# Patient Record
Sex: Female | Born: 1995 | Race: Black or African American | Hispanic: No | Marital: Single | Smoking: Former smoker
Health system: Southern US, Community
[De-identification: ages and names within clinical notes are randomized; demographics above are authoritative.]

## PROBLEM LIST (undated history)

## (undated) DIAGNOSIS — D573 Sickle-cell trait: Secondary | ICD-10-CM

## (undated) DIAGNOSIS — D649 Anemia, unspecified: Secondary | ICD-10-CM

## (undated) HISTORY — PX: NO PAST SURGERIES: SHX2092

## (undated) HISTORY — DX: Sickle-cell trait: D57.3

## (undated) HISTORY — DX: Anemia, unspecified: D64.9

---

## 2016-04-23 ENCOUNTER — Emergency Department (HOSPITAL_COMMUNITY)
Admission: EM | Admit: 2016-04-23 | Discharge: 2016-04-23 | Disposition: A | Discharge status: Home / Self Care | Payer: Medicaid Other | Attending: Emergency Medicine | Admitting: Emergency Medicine

## 2016-04-23 ENCOUNTER — Encounter (HOSPITAL_COMMUNITY): Payer: Self-pay

## 2016-04-23 DIAGNOSIS — Z3201 Encounter for pregnancy test, result positive: Secondary | ICD-10-CM | POA: Insufficient documentation

## 2016-04-23 DIAGNOSIS — Z3200 Encounter for pregnancy test, result unknown: Secondary | ICD-10-CM | POA: Diagnosis present

## 2016-04-23 DIAGNOSIS — Z3491 Encounter for supervision of normal pregnancy, unspecified, first trimester: Secondary | ICD-10-CM

## 2016-04-23 LAB — URINALYSIS, ROUTINE W REFLEX MICROSCOPIC
Bilirubin Urine: NEGATIVE
GLUCOSE, UA: NEGATIVE mg/dL
HGB URINE DIPSTICK: NEGATIVE
Ketones, ur: NEGATIVE mg/dL
Nitrite: NEGATIVE
Protein, ur: NEGATIVE mg/dL
SPECIFIC GRAVITY, URINE: 1.02 (ref 1.005–1.030)
pH: 5 (ref 5.0–8.0)

## 2016-04-23 LAB — URINE MICROSCOPIC-ADD ON: RBC / HPF: NONE SEEN RBC/hpf (ref 0–5)

## 2016-04-23 LAB — POC URINE PREG, ED: Preg Test, Ur: POSITIVE — AB

## 2016-04-23 MED ORDER — FOSFOMYCIN TROMETHAMINE 3 G PO PACK
3.0000 g | PACK | Freq: Once | ORAL | Status: AC
  Administered 2016-04-23: 3 g via ORAL
  Filled 2016-04-23: qty 3

## 2016-04-23 NOTE — Discharge Instructions (Signed)
Find an OB to start your care.  There are no known safe medications in pregnancy, tylenol is thought to be safe, but there has been no intense studies to confirm this.  Talk with your OB before starting any over the counter medications.

## 2016-04-23 NOTE — ED Provider Notes (Addendum)
[facility]-EMER[facility]ENCY DEPT Provider Note   CSN: [id number] Arrival date & time: 04/23/16  1205     History   Chief Complaint No chief complaint on file.   HPI Desiree Graham is a 20 y.o. female.  20 yo F with a chief complaint of possibly being pregnant. Patient had a home pregnancy test that was positive she wishes to be on Medicaid and so came to the ED for confirmation. She denies any symptoms denies vaginal bleeding denies cramping denies vomiting denies dysuria or increased frequency or hesitancy. This will be her second pregnancy. Her first child is 21-year-old. She has no planned OB care at this time.   The history is provided by the patient.  Illness  This is a new problem. The current episode started yesterday. The problem occurs constantly. The problem has not changed since onset.Pertinent negatives include no chest pain, no headaches and no shortness of breath. Nothing aggravates the symptoms. Nothing relieves the symptoms. She has tried nothing for the symptoms. The treatment provided no relief.    History reviewed. No pertinent past medical history.  There are no active problems to display for this patient.   History reviewed. No pertinent surgical history.  OB History    No data available       Home Medications    Prior to Admission medications   Not on File    Family History No family history on file.  Social History Social History  Substance Use Topics  . Smoking status: Not on file  . Smokeless tobacco: Not on file  . Alcohol use Not on file     Allergies   Patient has no known allergies.   Review of Systems Review of Systems  Constitutional: Negative for chills and fever.  HENT: Negative for congestion and rhinorrhea.   Eyes: Negative for redness and visual disturbance.  Respiratory: Negative for shortness of breath and wheezing.   Cardiovascular: Negative for chest pain and palpitations.  Gastrointestinal: Negative for nausea and vomiting.   Genitourinary: Negative for dysuria and urgency.  Musculoskeletal: Negative for arthralgias and myalgias.  Skin: Negative for pallor and wound.  Neurological: Negative for dizziness and headaches.     Physical Exam Updated Vital Signs BP 115/71 (BP Location: Left Arm)   Pulse 74   Temp 98.2 F (36.8 C) (Oral)   Resp 18   Ht 5\' 2"  (1.575 m)   Wt 88 lb (39.9 kg)   LMP 03/12/2016   SpO2 100%   BMI 16.10 kg/m   Physical Exam  Constitutional: She is oriented to person, place, and time. She appears well-developed and well-nourished. No distress.  HENT:  Head: Normocephalic and atraumatic.  Eyes: EOM are normal. Pupils are equal, round, and reactive to light.  Neck: Normal range of motion. Neck supple.  Cardiovascular: Normal rate and regular rhythm.  Exam reveals no gallop and no friction rub.   No murmur heard. Pulmonary/Chest: Effort normal. She has no wheezes. She has no rales.  Abdominal: Soft. She exhibits no distension and no mass. There is no tenderness. There is no guarding.  Musculoskeletal: She exhibits no edema or tenderness.  Neurological: She is alert and oriented to person, place, and time.  Skin: Skin is warm and dry. She is not diaphoretic.  Psychiatric: She has a normal mood and affect. Her behavior is normal.  Nursing note and vitals reviewed.    ED Treatments / Results  Labs (all labs ordered are listed, but only abnormal results are displayed) Labs  Reviewed  URINALYSIS, ROUTINE W REFLEX MICROSCOPIC (NOT AT [facility]) - Abnormal; Notable for the following:       Result Value   APPearance CLOUDY (*)    Leukocytes, UA MODERATE (*)    All other components within normal limits  URINE MICROSCOPIC-ADD ON - Abnormal; Notable for the following:    Squamous Epithelial / LPF 0-5 (*)    Bacteria, UA MANY (*)    All other components within normal limits  POC URINE PREG, ED - Abnormal; Notable for the following:    Preg Test, Ur POSITIVE (*)    All other components  within normal limits    EKG  EKG Interpretation None       Radiology No results found.  Procedures Procedures (including critical care time)  Medications Ordered in ED Medications  fosfomycin (MONUROL) packet 3 g (not administered)     Initial Impression / Assessment and Plan / ED Course  I have reviewed the triage vital signs and the nursing notes.  Pertinent labs & imaging results that were available during my care of the patient were reviewed by me and considered in my medical decision making (see chart for details).  Clinical Course     20 yo F Here for pregnancy confirmation so that she can apply for Medicaid. Denies any symptoms. Will obtain a UA to evaluate for UTI. [facility]  UA with possible asymptomatic UTI, will treat with fosfomycin.   1:56 PM:  I have discussed the diagnosis/risks/treatment options with the patient and family and believe the pt to be eligible for discharge home to follow-up with OB. We also discussed returning to the ED immediately if new or worsening sx occur. We discussed the sx which are most concerning (e.g., sudden worsening pain, fever, inability to tolerate by mouth, vaginal bleeding, pelvic cramping, blurry vision, leg swelling) that necessitate immediate return. Medications administered to the patient during their visit and any new prescriptions provided to the patient are listed below.  Medications given during this visit Medications  fosfomycin (MONUROL) packet 3 g (not administered)     The patient appears reasonably screen and/or stabilized for discharge and I doubt any other medical condition or other [facility] requiring further screening, evaluation, or treatment in the ED at this time prior to discharge.    Final Clinical Impressions(s) / ED Diagnoses   Final diagnoses:  First trimester pregnancy    New Prescriptions New Prescriptions   No medications on file     Melene PlanDan Donnita Farina, DO 04/23/16  1355    Melene Planan Nour Scalise, DO 04/23/16 1356

## 2016-04-23 NOTE — ED Triage Notes (Signed)
Patient requesting confirmation of pregnancy test that she took at home, no complaints

## 2017-03-30 ENCOUNTER — Encounter: Payer: Self-pay | Admitting: *Deleted

## 2017-04-12 ENCOUNTER — Encounter: Payer: Self-pay | Admitting: Student

## 2017-04-12 ENCOUNTER — Other Ambulatory Visit (HOSPITAL_COMMUNITY)
Admission: RE | Admit: 2017-04-12 | Discharge: 2017-04-12 | Disposition: A | Discharge status: Home / Self Care | Payer: Medicaid Other | Source: Ambulatory Visit | Attending: Student | Admitting: Student

## 2017-04-12 ENCOUNTER — Ambulatory Visit (INDEPENDENT_AMBULATORY_CARE_PROVIDER_SITE_OTHER): Payer: Medicaid Other | Admitting: Student

## 2017-04-12 VITALS — BP 112/68 | HR 106 | Wt 95.3 lb

## 2017-04-12 DIAGNOSIS — O09212 Supervision of pregnancy with history of pre-term labor, second trimester: Secondary | ICD-10-CM

## 2017-04-12 DIAGNOSIS — Z3481 Encounter for supervision of other normal pregnancy, first trimester: Secondary | ICD-10-CM | POA: Diagnosis not present

## 2017-04-12 DIAGNOSIS — Z3482 Encounter for supervision of other normal pregnancy, second trimester: Secondary | ICD-10-CM | POA: Diagnosis present

## 2017-04-12 DIAGNOSIS — Z331 Pregnant state, incidental: Secondary | ICD-10-CM

## 2017-04-12 DIAGNOSIS — B379 Candidiasis, unspecified: Secondary | ICD-10-CM | POA: Diagnosis not present

## 2017-04-12 DIAGNOSIS — Z113 Encounter for screening for infections with a predominantly sexual mode of transmission: Secondary | ICD-10-CM | POA: Diagnosis not present

## 2017-04-12 DIAGNOSIS — Z3480 Encounter for supervision of other normal pregnancy, unspecified trimester: Secondary | ICD-10-CM

## 2017-04-12 DIAGNOSIS — O09892 Supervision of other high risk pregnancies, second trimester: Secondary | ICD-10-CM

## 2017-04-12 DIAGNOSIS — A599 Trichomoniasis, unspecified: Secondary | ICD-10-CM

## 2017-04-12 DIAGNOSIS — Z3689 Encounter for other specified antenatal screening: Secondary | ICD-10-CM

## 2017-04-12 DIAGNOSIS — O0990 Supervision of high risk pregnancy, unspecified, unspecified trimester: Secondary | ICD-10-CM | POA: Insufficient documentation

## 2017-04-12 NOTE — Patient Instructions (Signed)
Preventing Preterm Birth Preterm birth is when your baby is delivered between 72 weeks and 37 weeks of pregnancy. A full-term pregnancy lasts for at least 37 weeks. Preterm birth can be dangerous for your baby because the last few weeks of pregnancy are an important time for your baby's brain and lungs to grow. Many things can cause a baby to be born early. Sometimes the cause is not known. There are certain factors that make you more likely to experience preterm birth, such as:  Having a previous baby born preterm.  Being pregnant with twins or other multiples.  Having had fertility treatment.  Being overweight or underweight at the start of your pregnancy.  Having any of the following during pregnancy: ? An infection, including a urinary tract infection (UTI) or an STI (sexually transmitted infection). ? High blood pressure. ? Diabetes. ? Vaginal bleeding.  Being age 38 or older.  Being age 50 or younger.  Getting pregnant within 6 months of a previous pregnancy.  Suffering extreme stress or physical or emotional abuse during pregnancy.  Standing for long periods of time during pregnancy, such as working at a job that requires standing.  What are the risks? The most serious risk of preterm birth is that the baby may not survive. This is more likely to happen if a baby is born before 13 weeks. Other risks and complications of preterm birth may include your baby having:  Breathing problems.  Brain damage that affects movement and coordination (cerebral palsy).  Feeding difficulties.  Vision or hearing problems.  Infections or inflammation of the digestive tract (colitis).  Developmental delays.  Learning disabilities.  Higher risk for diabetes, heart disease, and high blood pressure later in life.  What can I do to lower my risk? Medical care  The most important thing you can do to lower your risk for preterm birth is to get routine medical care during pregnancy  (prenatal care). If you have a high risk of preterm birth, you may be referred to a health care provider who specializes in managing high-risk pregnancies (perinatologist). You may be given medicine to help prevent preterm birth. Lifestyle changes Certain lifestyle changes can also lower your risk of preterm birth:  Wait at least 6 months after a pregnancy to become pregnant again.  Try to plan pregnancy for when you are between 57 and 77 years old.  Get to a healthy weight before getting pregnant. If you are overweight, work with your health care provider to safely lose weight.  Do not use any products that contain nicotine or tobacco, such as cigarettes and e-cigarettes. If you need help quitting, ask your health care provider.  Do not drink alcohol.  Do not use drugs.  Where to find support: For more support, consider:  Talking with your health care provider.  Talking with a therapist or substance abuse counselor, if you need help quitting.  Working with a diet and nutrition specialist (dietitian) or a Physiological scientist to maintain a healthy weight.  Joining a support group.  Where to find more information: Learn more about preventing preterm birth from:  Centers for Disease Control and Prevention: VoipObserver.com.br  March of Dimes: marchofdimes.org/complications/premature-babies.aspx  American Pregnancy Association: americanpregnancy.org/labor-and-birth/premature-labor  Contact a health care provider if:  You have any of the following signs of preterm labor before 37 weeks: ? A change or increase in vaginal discharge. ? Fluid leaking from your vagina. ? Pressure or cramps in your lower abdomen. ? A backache that does not  go away or gets worse. ? Regular tightening (contractions) in your lower abdomen. Summary  Preterm birth means having your baby during weeks 20-37 of pregnancy.  Preterm birth may put your baby at risk  for physical and mental problems.  Getting good prenatal care can help prevent preterm birth.  You can lower your risk of preterm birth by making certain lifestyle changes, such as not smoking and not using alcohol. This information is not intended to replace advice given to you by your health care provider. Make sure you discuss any questions you have with your health care provider. Document Released: 07/13/2015 Document Revised: 02/05/2016 Document Reviewed: 02/05/2016 Elsevier Interactive Patient Education  2018 [employer] Intrauterine Device Information An intrauterine device (IUD) is inserted into your uterus to prevent pregnancy. There are two types of IUDs available:  Copper IUD-This type of IUD is wrapped in copper wire and is placed inside the uterus. Copper makes the uterus and fallopian tubes produce a fluid that kills sperm. The copper IUD can stay in place for 10 years.  Hormone IUD-This type of IUD contains the hormone progestin (synthetic progesterone). The hormone thickens the cervical mucus and prevents sperm from entering the uterus. It also thins the uterine lining to prevent implantation of a fertilized egg. The hormone can weaken or kill the sperm that get into the uterus. One type of hormone IUD can stay in place for 5 years, and another type can stay in place for 3 years.  Your health care provider will make sure you are a good candidate for a contraceptive IUD. Discuss with your health care provider the possible side effects. Advantages of an intrauterine device  IUDs are highly effective, reversible, long acting, and low maintenance.  There are no estrogen-related side effects.  An IUD can be used when breastfeeding.  IUDs are not associated with weight gain.  The copper IUD works immediately after insertion.  The hormone IUD works right away if inserted within 7 days of your period starting. You will need to use a backup method of birth control for 7 days if  the hormone IUD is inserted at any other time in your cycle.  The copper IUD does not interfere with your female hormones.  The hormone IUD can make heavy menstrual periods lighter and decrease cramping.  The hormone IUD can be used for 3 or 5 years.  The copper IUD can be used for 10 years. Disadvantages of an intrauterine device  The hormone IUD can be associated with irregular bleeding patterns.  The copper IUD can make your menstrual flow heavier and more painful.  You may experience cramping and vaginal bleeding after insertion. This information is not intended to replace advice given to you by your health care provider. Make sure you discuss any questions you have with your health care provider. Document Released: 05/02/2004 Document Revised: 11/04/2015 Document Reviewed: 11/17/2012 Elsevier Interactive Patient Education  2017 [employer]

## 2017-04-12 NOTE — Progress Notes (Signed)
  Subjective:    Desiree Graham is being seen today for her first obstetrical visit.  This is not a planned pregnancy. She is at 2796w6d gestation. Her obstetrical history is significant for pre-term delivery in 2nd pregnancy. . Relationship with FOB: significant other, living together. Patient does intend to breast feed. Pregnancy history fully reviewed.  Patient reports no complaints.  Review of Systems:   Review of Systems  Constitutional: Negative.   HENT: Negative.   Respiratory: Negative.   Cardiovascular: Negative.   Gastrointestinal: Negative.   Genitourinary: Negative.   Musculoskeletal: Negative.   Psychiatric/Behavioral: Negative.     Objective:     BP 112/68   Pulse (!) 106   Wt 95 lb 4.8 oz (43.2 kg)   LMP 01/05/2017   BMI 17.43 kg/m  Physical Exam  Constitutional: She is oriented to person, place, and time. She appears well-developed and well-nourished.  HENT:  Head: Normocephalic.  Neck: Normal range of motion. No thyromegaly present.  GI: Soft. Bowel sounds are normal.  Genitourinary: Vagina normal.  Musculoskeletal: Normal range of motion.  Neurological: She is alert and oriented to person, place, and time.  Skin: Skin is warm and dry.  Psychiatric: She has a normal mood and affect.    Exam    Assessment:    Pregnancy: [id number] Patient Active Problem List   Diagnosis Date Noted  . Supervision of other normal pregnancy, antepartum 04/12/2017  . History of preterm delivery, currently pregnant in second trimester 04/12/2017       Plan:     Initial labs drawn. Prenatal vitamins. Problem list reviewed and updated. AFP3 discussed: undecided. Role of ultrasound in pregnancy discussed; fetal survey: will order. Amniocentesis discussed: not indicated. Follow up in 4 weeks. 50 % of 20 min visit spent on counseling and coordination of care.  -patient enrolled in babyRX optimization -patient will begin 17P program  Desiree Graham  [facility] 04/12/2017

## 2017-04-12 NOTE — Progress Notes (Signed)
Prenatal education packet given Plans to breastfeed Declines flu vaccine Babyscripts app

## 2017-04-13 LAB — OBSTETRIC PANEL, INCLUDING HIV
Antibody Screen: NEGATIVE
BASOS: 0 %
Basophils Absolute: 0 10*3/uL (ref 0.0–0.2)
EOS (ABSOLUTE): 0.1 10*3/uL (ref 0.0–0.4)
EOS: 1 %
HEMATOCRIT: 26.4 % — AB (ref 34.0–46.6)
HEMOGLOBIN: 8.2 g/dL — AB (ref 11.1–15.9)
HEP B S AG: NEGATIVE
HIV Screen 4th Generation wRfx: NONREACTIVE
IMMATURE GRANS (ABS): 0 10*3/uL (ref 0.0–0.1)
Immature Granulocytes: 0 %
LYMPHS ABS: 1.6 10*3/uL (ref 0.7–3.1)
LYMPHS: 16 %
MCH: 21 pg — AB (ref 26.6–33.0)
MCHC: 31.1 g/dL — ABNORMAL LOW (ref 31.5–35.7)
MCV: 68 fL — ABNORMAL LOW (ref 79–97)
Monocytes Absolute: 0.3 10*3/uL (ref 0.1–0.9)
Monocytes: 3 %
NEUTROS PCT: 80 %
Neutrophils Absolute: 7.9 10*3/uL — ABNORMAL HIGH (ref 1.4–7.0)
Platelets: 319 10*3/uL (ref 150–379)
RBC: 3.9 x10E6/uL (ref 3.77–5.28)
RDW: 16.9 % — ABNORMAL HIGH (ref 12.3–15.4)
RH TYPE: POSITIVE
RPR: NONREACTIVE
Rubella Antibodies, IGG: 18 index (ref >0.99)
WBC: 10 10*3/uL (ref 3.4–10.8)

## 2017-04-13 LAB — CERVICOVAGINAL ANCILLARY ONLY
BACTERIAL VAGINITIS: NEGATIVE
CANDIDA VAGINITIS: POSITIVE — AB
Chlamydia: NEGATIVE
Neisseria Gonorrhea: NEGATIVE
Trichomonas: POSITIVE — AB

## 2017-04-14 LAB — CULTURE, OB URINE

## 2017-04-14 LAB — URINE CULTURE, OB REFLEX

## 2017-04-17 ENCOUNTER — Other Ambulatory Visit: Payer: Self-pay | Admitting: Student

## 2017-04-17 ENCOUNTER — Encounter: Payer: Self-pay | Admitting: Student

## 2017-04-17 DIAGNOSIS — O99012 Anemia complicating pregnancy, second trimester: Secondary | ICD-10-CM | POA: Insufficient documentation

## 2017-04-17 DIAGNOSIS — A599 Trichomoniasis, unspecified: Secondary | ICD-10-CM

## 2017-04-17 MED ORDER — DOCUSATE SODIUM 100 MG PO TABS: 100.0000 mg | ORAL_TABLET | Freq: Two times a day (BID) | ORAL | 3 refills | Status: DC

## 2017-04-17 MED ORDER — METRONIDAZOLE 500 MG PO TABS: 2000.0000 mg | ORAL_TABLET | Freq: Once | ORAL | 0 refills | Status: AC

## 2017-04-17 MED ORDER — TERCONAZOLE 0.4 % VA CREA: 1.0000 | TOPICAL_CREAM | Freq: Every day | VAGINAL | 0 refills | Status: DC

## 2017-04-17 MED ORDER — FERROUS SULFATE 325 (65 FE) MG PO TBEC: 325.0000 mg | DELAYED_RELEASE_TABLET | Freq: Three times a day (TID) | ORAL | 3 refills | Status: DC

## 2017-04-20 ENCOUNTER — Encounter: Payer: Self-pay | Admitting: *Deleted

## 2017-05-02 ENCOUNTER — Encounter (HOSPITAL_COMMUNITY): Payer: Self-pay | Admitting: Student

## 2017-05-14 ENCOUNTER — Other Ambulatory Visit: Payer: Self-pay | Admitting: Student

## 2017-05-14 ENCOUNTER — Encounter (HOSPITAL_COMMUNITY): Payer: Self-pay

## 2017-05-14 ENCOUNTER — Ambulatory Visit (HOSPITAL_COMMUNITY)
Admission: RE | Admit: 2017-05-14 | Discharge: 2017-05-14 | Disposition: A | Discharge status: Home / Self Care | Payer: Medicaid Other | Source: Ambulatory Visit | Attending: Student | Admitting: Student

## 2017-05-14 DIAGNOSIS — Z3689 Encounter for other specified antenatal screening: Secondary | ICD-10-CM

## 2017-05-14 DIAGNOSIS — O09212 Supervision of pregnancy with history of pre-term labor, second trimester: Secondary | ICD-10-CM | POA: Insufficient documentation

## 2017-05-14 DIAGNOSIS — Z3A18 18 weeks gestation of pregnancy: Secondary | ICD-10-CM

## 2017-05-14 DIAGNOSIS — O09892 Supervision of other high risk pregnancies, second trimester: Secondary | ICD-10-CM

## 2017-05-14 DIAGNOSIS — Z3480 Encounter for supervision of other normal pregnancy, unspecified trimester: Secondary | ICD-10-CM

## 2017-05-16 ENCOUNTER — Encounter: Payer: Medicaid Other | Admitting: Advanced Practice Midwife

## 2017-05-17 ENCOUNTER — Encounter: Payer: Medicaid Other | Admitting: Student

## 2017-05-17 ENCOUNTER — Encounter: Payer: Self-pay | Admitting: General Practice

## 2017-06-01 ENCOUNTER — Ambulatory Visit (INDEPENDENT_AMBULATORY_CARE_PROVIDER_SITE_OTHER): Payer: Medicaid Other | Admitting: Obstetrics and Gynecology

## 2017-06-01 VITALS — BP 97/59 | HR 105 | Wt 105.5 lb

## 2017-06-01 DIAGNOSIS — A599 Trichomoniasis, unspecified: Secondary | ICD-10-CM

## 2017-06-01 DIAGNOSIS — Z3686 Encounter for antenatal screening for cervical length: Secondary | ICD-10-CM

## 2017-06-01 DIAGNOSIS — Z8619 Personal history of other infectious and parasitic diseases: Secondary | ICD-10-CM

## 2017-06-01 DIAGNOSIS — O99012 Anemia complicating pregnancy, second trimester: Secondary | ICD-10-CM

## 2017-06-01 DIAGNOSIS — O09892 Supervision of other high risk pregnancies, second trimester: Secondary | ICD-10-CM

## 2017-06-01 DIAGNOSIS — Z3480 Encounter for supervision of other normal pregnancy, unspecified trimester: Secondary | ICD-10-CM

## 2017-06-01 DIAGNOSIS — O09212 Supervision of pregnancy with history of pre-term labor, second trimester: Secondary | ICD-10-CM

## 2017-06-01 MED ORDER — METRONIDAZOLE 500 MG PO TABS: 2000.0000 mg | ORAL_TABLET | Freq: Once | ORAL | 0 refills | Status: AC

## 2017-06-01 NOTE — Progress Notes (Addendum)
   PRENATAL VISIT NOTE  Subjective:  Desiree Graham is a 21 y.o. G3P1102 at 6341w6d being seen today for ongoing prenatal care.  She is currently monitored for the following issues for this low-risk pregnancy and has Supervision of other normal pregnancy, antepartum; History of preterm delivery, currently pregnant in second trimester; Trichomoniasis; and Anemia in pregnancy, second trimester on their problem list.  Patient reports no complaints.  Contractions: Not present. Vag. Bleeding: None.  Movement: Present. Denies leaking of fluid.   The following portions of the patient's history were reviewed and updated as appropriate: allergies, current medications, past family history, past medical history, past social history, past surgical history and problem list. Problem list updated.  Objective:   Vitals:   06/01/17 0938  BP: (!) 97/59  Pulse: (!) 105  Weight: 105 lb 8 oz (47.9 kg)    Fetal Status: Fetal Heart Rate (bpm): 150 Fundal Height: 20 cm Movement: Present     General:  Alert, oriented and cooperative. Patient is in no acute distress.  Skin: Skin is warm and dry. No rash noted.   Cardiovascular: Normal heart rate noted  Respiratory: Normal respiratory effort, no problems with respiration noted  Abdomen: Soft, gravid, appropriate for gestational age.  Pain/Pressure: Absent     Pelvic: Cervical exam deferred        Extremities: Normal range of motion.  Edema: None  Mental Status:  Normal mood and affect. Normal behavior. Normal judgment and thought content.   Assessment and Plan:  Pregnancy: G3P1102 at 441w6d  1. History of trichomoniasis  Discussed the importance of treatment.  Partner needs treatment.  Rx again sent for Flagyl   2. History of preterm delivery, currently pregnant in second trimester  Cervical length at 22 weeks and 24 weeks  Delivery at 35.5 weeks due to preterm labor  No complaints Declines 17p  3. Supervision of other normal pregnancy,  antepartum  - Declined Flu vaccine  - Decline Quad- genetic testing   4. Trichomoniasis  Rx: Flagyl   5. Anemia in pregnancy, second trimester  Rx: Iron, patient did not fill. Discussed importance of taking Iron along with iron rich foods.   Preterm labor symptoms and general obstetric precautions including but not limited to vaginal bleeding, contractions, leaking of fluid and fetal movement were reviewed in detail with the patient. Please refer to After Visit Summary for other counseling recommendations.  Return in about 4 weeks (around 06/29/2017).   Venia CarbonJennifer Staphanie Harbison, NP

## 2017-06-01 NOTE — Patient Instructions (Addendum)
Pregnancy and Anemia Anemia is a condition in which the concentration of red blood cells or hemoglobin in the blood is below normal. Hemoglobin is a substance in red blood cells that carries oxygen to the tissues of the body. Anemia results in not enough oxygen reaching these tissues. Anemia during pregnancy is common because the fetus uses more iron and folic acid as it is developing. Your body may not produce enough red blood cells because of this. Also, during pregnancy, the liquid part of the blood (plasma) increases by about 50%, and the red blood cells increase by only 25%. This lowers the concentration of the red blood cells and creates a natural anemia-like situation. What are the causes? The most common cause of anemia during pregnancy is not having enough iron in the body to make red blood cells (iron deficiency anemia). Other causes may include:  Folic acid deficiency.  Vitamin B12 deficiency.  Certain prescription or over-the-counter medicines.  Certain medical conditions or infections that destroy red blood cells.  A low platelet count and bleeding caused by antibodies that go through the placenta to the fetus from the mother's blood.  What are the signs or symptoms? Mild anemia may not be noticeable. If it becomes severe, symptoms may include:  Tiredness.  Shortness of breath, especially with exercise.  Weakness.  Fainting.  Pale looking skin.  Headaches.  Feeling a fast or irregular heartbeat (palpitations).  How is this diagnosed? The type of anemia is usually diagnosed from your family and medical history and blood tests. How is this treated? Treatment of anemia during pregnancy depends on the cause of the anemia. Treatment can include:  Supplements of iron, vitamin B12, or folic acid.  A blood transfusion. This may be needed if blood loss is severe.  Hospitalization. This may be needed if there is significant continual blood loss.  Dietary  changes.  Follow these instructions at home:  Follow your dietitian's or health care provider's dietary recommendations.  Increase your vitamin C intake. This will help the stomach absorb more iron.  Eat a diet rich in iron. This would include foods such as: ? Liver. ? Beef. ? Whole grain bread. ? Eggs. ? Dried fruit.  Take iron and vitamins as directed by your health care provider.  Eat green leafy vegetables. These are a good source of folic acid. Contact a health care provider if:  You have frequent or lasting headaches.  You are looking pale.  You are bruising easily. Get help right away if:  You have extreme weakness, shortness of breath, or chest pain.  You become dizzy or have trouble concentrating.  You have heavy vaginal bleeding.  You develop a rash.  You have bloody or black, tarry stools.  You faint.  You vomit up blood.  You vomit repeatedly.  You have abdominal pain.  You have a fever or persistent symptoms for more than 2-3        Trichomoniasis Trichomoniasis is an STI (sexually transmitted infection) that can affect both women and men. In women, the outer area of the female genitalia (vulva) and the vagina are affected. In men, the penis is mainly affected, but the prostate and other reproductive organs can also be involved. This condition can be treated with medicine. It often has no symptoms (is asymptomatic), especially in men. What are the causes? This condition is caused by an organism called Trichomonas vaginalis. Trichomoniasis most often spreads from person to person (is contagious) through sexual contact. What increases the  risk? The following factors may make you more likely to develop this condition: Having unprotected sexual intercourse. Having sexual intercourse with a partner who has trichomoniasis. Having multiple sexual partners. Having had previous trichomoniasis infections or other STIs.  What are the signs or  symptoms? In women, symptoms of trichomoniasis include: Abnormal vaginal discharge that is clear, white, gray, or yellow-green and foamy and has an unusual "fishy" odor. Itching and irritation of the vagina and vulva. Burning or pain during urination or sexual intercourse. Genital redness and swelling.  In men, symptoms of trichomoniasis include: Penile discharge that may be foamy or contain pus. Pain in the penis. This may happen only when urinating. Itching or irritation inside the penis. Burning after urination or ejaculation.  How is this diagnosed? In women, this condition may be found during a routine Pap test or physical exam. It may be found in men during a routine physical exam. Your health care provider may perform tests to help diagnose this infection, such as: Urine tests (men and women). The following in women: Testing the pH of the vagina. A vaginal swab test that checks for the Trichomonas vaginalis organism. Testing vaginal secretions.  Your health care provider may test you for other STIs, including HIV (human immunodeficiency virus). How is this treated? This condition is treated with medicine taken by mouth (orally), such as metronidazole or tinidazole to fight the infection. Your sexual partner(s) may also need to be tested and treated. If you are a woman and you plan to become pregnant or think you may be pregnant, tell your health care provider right away. Some medicines that are used to treat the infection should not be taken during pregnancy.  Your health care provider may recommend over-the-counter medicines or creams to help relieve itching or irritation. You may be tested for infection again 3 months after treatment. Follow these instructions at home: Take and use over-the-counter and prescription medicines, including creams, only as told by your health care provider. Do not have sexual intercourse until one week after you finish your medicine, or until your  health care provider approves. Ask your health care provider when you may resume sexual intercourse. (Women) Do not douche or wear tampons while you have the infection. Discuss your infection with your sexual partner(s). Make sure that your partner gets tested and treated, if necessary. Keep all follow-up visits as told by your health care provider. This is important. How is this prevented? Use condoms every time you have sex. Using condoms correctly and consistently can help protect against STIs. Avoid having multiple sexual partners. Talk with your sexual partner about any symptoms that either of you may have, as well as any history of STIs. Get tested for STIs and STDs (sexually transmitted diseases) before you have sex. Ask your partner to do the same. Do not have sexual contact if you have symptoms of trichomoniasis or another STI. Contact a health care provider if: You still have symptoms after you finish your medicine. You develop pain in your abdomen. You have pain when you urinate. You have bleeding after sexual intercourse. You develop a rash. You feel nauseous or you vomit. You plan to become pregnant or think you may be pregnant. Summary Trichomoniasis is an STI (sexually transmitted infection) that can affect both women and men. This condition often has no symptoms (is asymptomatic), especially in men. You should not have sexual intercourse until one week after you finish your medicine, or until your health care provider approves. Ask your  health care provider when you may resume sexual intercourse. Discuss your infection with your sexual partner. Make sure that your partner gets tested and treated, if necessary. This information is not intended to replace advice given to you by your health care provider. Make sure you discuss any questions you have with your health care provider. Document Released: 11/22/2000 Document Revised: 04/21/2016 Document Reviewed: 04/21/2016 Elsevier  Interactive Patient Education  2017 [employer]   days.  You have a fever and your symptoms suddenly get worse.  You are dehydrated. This information is not intended to replace advice given to you by your health care provider. Make sure you discuss any questions you have with your health care provider. Document Released: 05/26/2000 Document Revised: 11/04/2015 Document Reviewed: 01/08/2013 Elsevier Interactive Patient Education  2017 [employer]

## 2017-06-01 NOTE — Addendum Note (Signed)
Addended by: Hamilton CapriBURCH, Annastasia Haskins J on: 06/01/2017 10:31 AM   Modules accepted: Orders

## 2017-06-04 ENCOUNTER — Encounter: Payer: Self-pay | Admitting: Obstetrics and Gynecology

## 2017-06-04 ENCOUNTER — Telehealth: Payer: Self-pay | Admitting: Obstetrics and Gynecology

## 2017-06-04 NOTE — Telephone Encounter (Signed)
I attempted to call Ms. Desiree Graham to notify her of the recommendation for ultrasounds at 22 weeks and 24 weeks to measure her cervical length. She has a history of preterm delivery, not on 17P. Please notify the patient if she calls that the order is place and the [place] department will call her to schedule. I also left a message with her through Mychart as her phone had no option to leave a voicemail. Thank you.  Marella ChimesJennifer Etan Vasudevan  Danuta Huseman I, NP 06/04/2017 8:46 AM

## 2017-06-11 ENCOUNTER — Other Ambulatory Visit: Payer: Self-pay | Admitting: Obstetrics and Gynecology

## 2017-06-11 ENCOUNTER — Ambulatory Visit (HOSPITAL_COMMUNITY)
Admission: RE | Admit: 2017-06-11 | Discharge: 2017-06-11 | Disposition: A | Discharge status: Home / Self Care | Payer: Medicaid Other | Source: Ambulatory Visit | Attending: Obstetrics and Gynecology | Admitting: Obstetrics and Gynecology

## 2017-06-11 DIAGNOSIS — O09899 Supervision of other high risk pregnancies, unspecified trimester: Secondary | ICD-10-CM

## 2017-06-11 DIAGNOSIS — O09212 Supervision of pregnancy with history of pre-term labor, second trimester: Secondary | ICD-10-CM | POA: Diagnosis present

## 2017-06-11 DIAGNOSIS — Z3686 Encounter for antenatal screening for cervical length: Secondary | ICD-10-CM

## 2017-06-11 DIAGNOSIS — O09892 Supervision of other high risk pregnancies, second trimester: Secondary | ICD-10-CM | POA: Insufficient documentation

## 2017-06-11 DIAGNOSIS — Z3A22 22 weeks gestation of pregnancy: Secondary | ICD-10-CM

## 2017-06-11 DIAGNOSIS — O09219 Supervision of pregnancy with history of pre-term labor, unspecified trimester: Secondary | ICD-10-CM

## 2017-06-12 NOTE — L&D Delivery Note (Signed)
22 y.o. [id number] at 7463w3d delivered a viable baby girl infant in cephalic, LOA position. There was no nuchal cord, but there was a complex presentation with left hand. The anterior shoulder delivered with ease. 60 sec delayed cord clamping was performed. Cord clamped x2 and cut. Placenta delivered spontaneously intact, with 3VC. Fundus firm on exam with massage and pitocin. Good hemostasis noted.  Anesthesia: Epidural Laceration: none Suture:  n/a Good hemostasis noted. EBL: 200cc  Mom and baby recovering in LDR.    Apgars: APGAR (1 MIN): 8   APGAR (5 MINS): 9   APGAR (10 MINS):   Weight: Pending skin to skin    Gorden HarmsMegan Joanna Hall, MD PGY-3 10/02/2017, 12:23 PM

## 2017-06-22 ENCOUNTER — Encounter: Payer: Medicaid Other | Admitting: Obstetrics and Gynecology

## 2017-06-22 NOTE — Progress Notes (Deleted)
Subjective:  Desiree Graham is a 22 y.o. G3P1102 at 2526w6d being seen today for ongoing prenatal care.  She is currently monitored for the following issues for this {Blank single:19197::"high-risk","low-risk"} pregnancy and has Supervision of other normal pregnancy, antepartum; History of preterm delivery, currently pregnant in second trimester; Trichomoniasis; and Anemia in pregnancy, second trimester on their problem list.  Patient reports {sx:14538}.   .  .   . Denies leaking of fluid.   The following portions of the patient's history were reviewed and updated as appropriate: allergies, current medications, past family history, past medical history, past social history, past surgical history and problem list. Problem list updated.  Objective:  There were no vitals filed for this visit.  Fetal Status:           General:  Alert, oriented and cooperative. Patient is in no acute distress.  Skin: Skin is warm and dry. No rash noted.   Cardiovascular: Normal heart rate noted  Respiratory: Normal respiratory effort, no problems with respiration noted  Abdomen: Soft, gravid, appropriate for gestational age.       Pelvic:       {Blank single:19197::"Cervical exam performed","Cervical exam deferred"}        Extremities: Normal range of motion.     Mental Status: Normal mood and affect. Normal behavior. Normal judgment and thought content.   Urinalysis:      Assessment and Plan:  Pregnancy: G3P1102 at 6726w6d  1. Supervision of other normal pregnancy, antepartum ***  2. History of preterm delivery, currently pregnant in second trimester ***  3. Trichomoniasis ***  4. Anemia in pregnancy, second trimester ***  {Blank single:19197::"Term","Preterm"} labor symptoms and general obstetric precautions including but not limited to vaginal bleeding, contractions, leaking of fluid and fetal movement were reviewed in detail with the patient. Please refer to After Visit Summary for other counseling  recommendations.  No Follow-up on file.   Pincus LargePhelps, Jazma Y, DO

## 2017-06-28 ENCOUNTER — Other Ambulatory Visit (HOSPITAL_COMMUNITY)
Admission: RE | Admit: 2017-06-28 | Discharge: 2017-06-28 | Disposition: A | Discharge status: Home / Self Care | Payer: Medicaid Other | Source: Ambulatory Visit | Attending: Student | Admitting: Student

## 2017-06-28 ENCOUNTER — Ambulatory Visit (INDEPENDENT_AMBULATORY_CARE_PROVIDER_SITE_OTHER): Payer: Medicaid Other | Admitting: Student

## 2017-06-28 VITALS — BP 105/63 | HR 82 | Wt 111.1 lb

## 2017-06-28 DIAGNOSIS — Z3480 Encounter for supervision of other normal pregnancy, unspecified trimester: Secondary | ICD-10-CM

## 2017-06-28 DIAGNOSIS — A599 Trichomoniasis, unspecified: Secondary | ICD-10-CM | POA: Diagnosis present

## 2017-06-28 DIAGNOSIS — O99012 Anemia complicating pregnancy, second trimester: Secondary | ICD-10-CM

## 2017-06-28 DIAGNOSIS — O09892 Supervision of other high risk pregnancies, second trimester: Secondary | ICD-10-CM

## 2017-06-28 DIAGNOSIS — O09212 Supervision of pregnancy with history of pre-term labor, second trimester: Secondary | ICD-10-CM

## 2017-06-28 MED ORDER — FERROUS SULFATE 325 (65 FE) MG PO TBEC: 325.0000 mg | DELAYED_RELEASE_TABLET | Freq: Three times a day (TID) | ORAL | 3 refills | Status: DC

## 2017-06-28 NOTE — Progress Notes (Signed)
   PRENATAL VISIT NOTE  Subjective:  Desiree Graham is a 22 y.o. G3P1102 at 6199w5d being seen today for ongoing prenatal care.  She is currently monitored for the following issues for this low-risk pregnancy and has Supervision of other normal pregnancy, antepartum; History of preterm delivery, currently pregnant in second trimester; Trichomoniasis; and Anemia in pregnancy, second trimester on their problem list.  Patient reports no complaints.  Contractions: Not present. Vag. Bleeding: None.  Movement: Present. Denies leaking of fluid.   The following portions of the patient's history were reviewed and updated as appropriate: allergies, current medications, past family history, past medical history, past social history, past surgical history and problem list. Problem list updated.  Objective:   Vitals:   06/28/17 1459  BP: 105/63  Pulse: 82  Weight: 111 lb 1.6 oz (50.4 kg)    Fetal Status: Fetal Heart Rate (bpm): 154   Movement: Present     General:  Alert, oriented and cooperative. Patient is in no acute distress.  Skin: Skin is warm and dry. No rash noted.   Cardiovascular: Normal heart rate noted  Respiratory: Normal respiratory effort, no problems with respiration noted  Abdomen: Soft, gravid, appropriate for gestational age.  Pain/Pressure: Absent     Pelvic: Cervical exam deferred        Extremities: Normal range of motion.  Edema: None  Mental Status:  Normal mood and affect. Normal behavior. Normal judgment and thought content.   Assessment and Plan:  Pregnancy: G3P1102 at 9499w5d  1. Trichomoniasis Patient states she finished meds; partner in the room and patient anxious to leave so I did not ask more questions.  - Cervicovaginal ancillary only as TOC  2. Anemia in pregnancy, second trimester -reminded her to keep taking her iron - CBC  3. History of preterm delivery, currently pregnant in second trimester -Declined 17p; patient states she does not want anything  "unnatural" in her body and her mom told her that she had never heard of it.  -Encouraged patient to reconsider and gave her information on preventing pre-term birth - [place] MFM OB Transvaginal; Future  4. Supervision of other normal pregnancy, antepartum   Preterm labor symptoms and general obstetric precautions including but not limited to vaginal bleeding, contractions, leaking of fluid and fetal movement were reviewed in detail with the patient. Please refer to After Visit Summary for other counseling recommendations.  Return in about 4 weeks (around 07/26/2017).   Desiree Graham, CNM

## 2017-06-28 NOTE — Patient Instructions (Signed)
Preventing Preterm Birth °Preterm birth is when your baby is delivered between 20 weeks and 37 weeks of pregnancy. A full-term pregnancy lasts for at least 37 weeks. Preterm birth can be dangerous for your baby because the last few weeks of pregnancy are an important time for your baby's brain and lungs to grow. Many things can cause a baby to be born early. Sometimes the cause is not known. There are certain factors that make you more likely to experience preterm birth, such as: °· Having a previous baby born preterm. °· Being pregnant with twins or other multiples. °· Having had fertility treatment. °· Being overweight or underweight at the start of your pregnancy. °· Having any of the following during pregnancy: °? An infection, including a urinary tract infection (UTI) or an STI (sexually transmitted infection). °? High blood pressure. °? Diabetes. °? Vaginal bleeding. °· Being age 35 or older. °· Being age 18 or younger. °· Getting pregnant within 6 months of a previous pregnancy. °· Suffering extreme stress or physical or emotional abuse during pregnancy. °· Standing for long periods of time during pregnancy, such as working at a job that requires standing. ° °What are the risks? °The most serious risk of preterm birth is that the baby may not survive. This is more likely to happen if a baby is born before 34 weeks. Other risks and complications of preterm birth may include your baby having: °· Breathing problems. °· Brain damage that affects movement and coordination (cerebral palsy). °· Feeding difficulties. °· Vision or hearing problems. °· Infections or inflammation of the digestive tract (colitis). °· Developmental delays. °· Learning disabilities. °· Higher risk for diabetes, heart disease, and high blood pressure later in life. ° °What can I do to lower my risk? °Medical care ° °The most important thing you can do to lower your risk for preterm birth is to get routine medical care during pregnancy  (prenatal care). If you have a high risk of preterm birth, you may be referred to a health care provider who specializes in managing high-risk pregnancies (perinatologist). You may be given medicine to help prevent preterm birth. °Lifestyle changes °Certain lifestyle changes can also lower your risk of preterm birth: °· Wait at least 6 months after a pregnancy to become pregnant again. °· Try to plan pregnancy for when you are between 19 and 35 years old. °· Get to a healthy weight before getting pregnant. If you are overweight, work with your health care provider to safely lose weight. °· Do not use any products that contain nicotine or tobacco, such as cigarettes and e-cigarettes. If you need help quitting, ask your health care provider. °· Do not drink alcohol. °· Do not use drugs. ° °Where to find support: °For more support, consider: °· Talking with your health care provider. °· Talking with a therapist or substance abuse counselor, if you need help quitting. °· Working with a diet and nutrition specialist (dietitian) or a personal trainer to maintain a healthy weight. °· Joining a support group. ° °Where to find more information: °Learn more about preventing preterm birth from: °· Centers for Disease Control and Prevention: cdc.gov/reproductivehealth/maternalinfanthealth/pretermbirth.htm °· March of Dimes: marchofdimes.org/complications/premature-babies.aspx °· American Pregnancy Association: americanpregnancy.org/labor-and-birth/premature-labor ° °Contact a health care provider if: °· You have any of the following signs of preterm labor before 37 weeks: °? A change or increase in vaginal discharge. °? Fluid leaking from your vagina. °? Pressure or cramps in your lower abdomen. °? A backache that does not   go away or gets worse. °? Regular tightening (contractions) in your lower abdomen. °Summary °· Preterm birth means having your baby during weeks 20-37 of pregnancy. °· Preterm birth may put your baby at risk  for physical and mental problems. °· Getting good prenatal care can help prevent preterm birth. °· You can lower your risk of preterm birth by making certain lifestyle changes, such as not smoking and not using alcohol. °This information is not intended to replace advice given to you by your health care provider. Make sure you discuss any questions you have with your health care provider. °Document Released: 07/13/2015 Document Revised: 02/05/2016 Document Reviewed: 02/05/2016 °Elsevier Interactive Patient Education © 2018 Elsevier Inc. ° °

## 2017-06-29 LAB — CBC
HEMATOCRIT: 26.7 % — AB (ref 34.0–46.6)
Hemoglobin: 8.1 g/dL — ABNORMAL LOW (ref 11.1–15.9)
MCH: 20.7 pg — ABNORMAL LOW (ref 26.6–33.0)
MCHC: 30.3 g/dL — AB (ref 31.5–35.7)
MCV: 68 fL — AB (ref 79–97)
Platelets: 238 10*3/uL (ref 150–379)
RBC: 3.92 x10E6/uL (ref 3.77–5.28)
RDW: 18 % — AB (ref 12.3–15.4)
WBC: 9.6 10*3/uL (ref 3.4–10.8)

## 2017-06-29 LAB — CERVICOVAGINAL ANCILLARY ONLY
Chlamydia: NEGATIVE
Neisseria Gonorrhea: NEGATIVE
Trichomonas: NEGATIVE

## 2017-07-02 ENCOUNTER — Telehealth: Payer: Self-pay | Admitting: Student

## 2017-07-02 NOTE — Telephone Encounter (Signed)
Tried to reach patient to confirm that she is taking iron 3 times a day. No answer at cell phone or home phone.

## 2017-07-10 ENCOUNTER — Ambulatory Visit (HOSPITAL_COMMUNITY)
Admission: RE | Admit: 2017-07-10 | Discharge: 2017-07-10 | Disposition: A | Discharge status: Home / Self Care | Payer: Medicaid Other | Source: Ambulatory Visit | Attending: Student | Admitting: Student

## 2017-07-17 ENCOUNTER — Encounter (HOSPITAL_COMMUNITY): Payer: Self-pay

## 2017-07-17 ENCOUNTER — Ambulatory Visit (HOSPITAL_COMMUNITY)
Admission: RE | Admit: 2017-07-17 | Discharge: 2017-07-17 | Disposition: A | Discharge status: Home / Self Care | Payer: Medicaid Other | Source: Ambulatory Visit | Attending: Student | Admitting: Student

## 2017-07-17 DIAGNOSIS — O09892 Supervision of other high risk pregnancies, second trimester: Secondary | ICD-10-CM | POA: Diagnosis not present

## 2017-07-17 DIAGNOSIS — O09212 Supervision of pregnancy with history of pre-term labor, second trimester: Secondary | ICD-10-CM | POA: Diagnosis present

## 2017-07-17 DIAGNOSIS — Z3686 Encounter for antenatal screening for cervical length: Secondary | ICD-10-CM | POA: Insufficient documentation

## 2017-07-17 DIAGNOSIS — Z3A27 27 weeks gestation of pregnancy: Secondary | ICD-10-CM | POA: Diagnosis not present

## 2017-08-02 ENCOUNTER — Ambulatory Visit (INDEPENDENT_AMBULATORY_CARE_PROVIDER_SITE_OTHER): Payer: Medicaid Other | Admitting: Student

## 2017-08-02 VITALS — BP 95/66 | HR 93 | Wt 116.0 lb

## 2017-08-02 DIAGNOSIS — O09892 Supervision of other high risk pregnancies, second trimester: Secondary | ICD-10-CM

## 2017-08-02 DIAGNOSIS — D508 Other iron deficiency anemias: Secondary | ICD-10-CM

## 2017-08-02 DIAGNOSIS — Z3480 Encounter for supervision of other normal pregnancy, unspecified trimester: Secondary | ICD-10-CM

## 2017-08-02 DIAGNOSIS — O99012 Anemia complicating pregnancy, second trimester: Secondary | ICD-10-CM

## 2017-08-02 DIAGNOSIS — O09212 Supervision of pregnancy with history of pre-term labor, second trimester: Secondary | ICD-10-CM

## 2017-08-02 DIAGNOSIS — Z3493 Encounter for supervision of normal pregnancy, unspecified, third trimester: Secondary | ICD-10-CM

## 2017-08-02 MED ORDER — DOCUSATE SODIUM 100 MG PO TABS: 100.0000 mg | ORAL_TABLET | Freq: Three times a day (TID) | ORAL | 3 refills | Status: DC

## 2017-08-02 NOTE — Patient Instructions (Signed)

## 2017-08-02 NOTE — Progress Notes (Signed)
   PRENATAL VISIT NOTE  Subjective:  Desiree Graham is a 22 y.o. G3P1102 at [redacted]w[redacted]d being seen today for ongoing prenatal care.  She is currently monitored for the following issues for this high-risk pregnancy and has Supervision of other normal pregnancy, antepartum; History of preterm delivery, currently pregnant in second trimester; Trichomoniasis; and Anemia in pregnancy, second trimester on their problem list.  Patient reports no complaints. She has been taking her iron but only 3 times a week. Has some constipation; did not start taking the medications that were prescribed for her before for constipation.  Contractions: Not present. Vag. Bleeding: None.  Movement: Present. Denies leaking of fluid.   The following portions of the patient's history were reviewed and updated as appropriate: allergies, current medications, past family history, past medical history, past social history, past surgical history and problem list. Problem list updated.  Objective:   Vitals:   08/02/17 1053  BP: 95/66  Pulse: 93  Weight: 116 lb (52.6 kg)    Fetal Status: Fetal Heart Rate (bpm): 163 Fundal Height: 29 cm Movement: Present     General:  Alert, oriented and cooperative. Patient is in no acute distress.  Skin: Skin is warm and dry. No rash noted.   Cardiovascular: Normal heart rate noted  Respiratory: Normal respiratory effort, no problems with respiration noted  Abdomen: Soft, gravid, appropriate for gestational age.  Pain/Pressure: Absent     Pelvic: Cervical exam deferred        Extremities: Normal range of motion.  Edema: None  Mental Status:  Normal mood and affect. Normal behavior. Normal judgment and thought content.   Assessment and Plan:  Pregnancy: G3P1102 at [redacted]w[redacted]d  1. Other iron deficiency anemia -Reviewed importance of taking iron and colace; if Hgb is not improved she may need Fereheme infusion.  - Iron, TIBC and Ferritin Panel  2. Encounter for supervision of normal pregnancy  in third trimester, unspecified gravidity - CBC - HIV antibody (with reflex) - RPR -1 hour done today because she has a hard time making her appointments bc of work  3. Supervision of other normal pregnancy, antepartum   4. History of preterm delivery, currently pregnant in second trimester -Does not have any signs of ptl today; per Dr. Marice Potterove not necessary to do cervical exams digitally unless patient has any complaint. Strict return precautions reviewed on when to visit MAU (bleeding, leaking of fluid, contractions, increaseing pain and pressure)   5. Anemia in pregnancy, second trimester Patient will start colace in addition to her iron.   Preterm labor symptoms and general obstetric precautions including but not limited to vaginal bleeding, contractions, leaking of fluid and fetal movement were reviewed in detail with the patient. Please refer to After Visit Summary for other counseling recommendations.  Return in about 2 weeks (around 08/16/2017).   Desiree Graham, CNM

## 2017-08-03 ENCOUNTER — Telehealth: Payer: Self-pay | Admitting: Student

## 2017-08-03 LAB — CBC
Hematocrit: 25.7 % — ABNORMAL LOW (ref 34.0–46.6)
Hemoglobin: 7.8 g/dL — ABNORMAL LOW (ref 11.1–15.9)
MCH: 19.8 pg — ABNORMAL LOW (ref 26.6–33.0)
MCHC: 30.4 g/dL — ABNORMAL LOW (ref 31.5–35.7)
MCV: 65 fL — AB (ref 79–97)
Platelets: 249 10*3/uL (ref 150–379)
RBC: 3.94 x10E6/uL (ref 3.77–5.28)
RDW: 17.3 % — AB (ref 12.3–15.4)
WBC: 10.4 10*3/uL (ref 3.4–10.8)

## 2017-08-03 LAB — IRON,TIBC AND FERRITIN PANEL
FERRITIN: 5 ng/mL — AB (ref 15–150)
IRON: 19 ug/dL — AB (ref 27–159)
Iron Saturation: 3 % — CL (ref 15–55)
Total Iron Binding Capacity: 558 ug/dL (ref 250–450)
UIBC: 539 ug/dL — AB (ref 131–425)

## 2017-08-03 LAB — HIV ANTIBODY (ROUTINE TESTING W REFLEX): HIV Screen 4th Generation wRfx: NONREACTIVE

## 2017-08-03 LAB — RPR: RPR: NONREACTIVE

## 2017-08-03 LAB — GLUCOSE TOLERANCE, 1 HOUR: GLUCOSE, 1HR PP: 66 mg/dL (ref 65–199)

## 2017-08-03 NOTE — Telephone Encounter (Signed)
Called patiend and left message to call back before 9 pm today to talk about results. Gave her phone number at [facility]; emphasized she cannot reach me after 9 pm.  Otherwise, clinic staff will call her next week.  Luna KitchensKathryn Kooistra CNM

## 2017-08-06 ENCOUNTER — Encounter: Payer: Self-pay | Admitting: *Deleted

## 2017-08-06 ENCOUNTER — Telehealth: Payer: Self-pay | Admitting: General Practice

## 2017-08-06 NOTE — Telephone Encounter (Signed)
-----   Message from Marylene LandKathryn Lorraine Kooistra, CNM sent at 08/03/2017  3:45 PM EST ----- Regarding: patient needs feraheme infusion Hi! This patient's Hgb is quite low; she needs a fereheme infusion this week (week of 2-25) and next week. I spoke to Dr. Mervyn SkeetersA, she said this is something you all can set up. I will call her and let her know that we will be contacting her about this.   Thank you,  Luna KitchensKathryn Kooistra

## 2017-08-06 NOTE — Telephone Encounter (Signed)
[facility]. Called patient, no answer- left message stating we are trying to reach you with a scheduled appt as well as lab results. Please check your mychart account for additional information. Will send mychart message

## 2017-08-10 ENCOUNTER — Encounter: Payer: Self-pay | Admitting: General Practice

## 2017-08-10 ENCOUNTER — Telehealth: Payer: Self-pay | Admitting: General Practice

## 2017-08-10 NOTE — Telephone Encounter (Signed)
Per chart review, patient still has not checked mychart account. Called patient & phone immediately goes to voicemail. Left message to call us back as soon as possible regarding a referral appt scheduled for Tuesday as well as results. Called emergency contact number and phone line is busy. Will send certified letter today

## 2017-08-13 ENCOUNTER — Other Ambulatory Visit (HOSPITAL_COMMUNITY): Payer: Self-pay | Admitting: *Deleted

## 2017-08-14 ENCOUNTER — Ambulatory Visit (HOSPITAL_COMMUNITY)
Admission: RE | Admit: 2017-08-14 | Discharge: 2017-08-14 | Disposition: A | Discharge status: Home / Self Care | Payer: Medicaid Other | Source: Ambulatory Visit | Attending: Obstetrics & Gynecology | Admitting: Obstetrics & Gynecology

## 2017-08-14 DIAGNOSIS — D649 Anemia, unspecified: Secondary | ICD-10-CM | POA: Insufficient documentation

## 2017-08-14 DIAGNOSIS — Z3A00 Weeks of gestation of pregnancy not specified: Secondary | ICD-10-CM | POA: Insufficient documentation

## 2017-08-14 DIAGNOSIS — O99019 Anemia complicating pregnancy, unspecified trimester: Secondary | ICD-10-CM | POA: Diagnosis not present

## 2017-08-14 MED ORDER — SODIUM CHLORIDE 0.9 % IV SOLN
510.0000 mg | INTRAVENOUS | Status: DC
  Administered 2017-08-14: 510 mg via INTRAVENOUS
  Filled 2017-08-14: qty 17

## 2017-08-14 NOTE — Discharge Instructions (Signed)

## 2017-08-21 ENCOUNTER — Ambulatory Visit (HOSPITAL_COMMUNITY): Payer: Medicaid Other

## 2017-08-21 ENCOUNTER — Encounter: Payer: Medicaid Other | Admitting: Obstetrics and Gynecology

## 2017-08-22 ENCOUNTER — Ambulatory Visit (INDEPENDENT_AMBULATORY_CARE_PROVIDER_SITE_OTHER): Payer: Medicaid Other | Admitting: Obstetrics and Gynecology

## 2017-08-22 ENCOUNTER — Other Ambulatory Visit (HOSPITAL_COMMUNITY)
Admission: RE | Admit: 2017-08-22 | Discharge: 2017-08-22 | Disposition: A | Discharge status: Home / Self Care | Payer: Medicaid Other | Source: Ambulatory Visit | Attending: Family Medicine | Admitting: Family Medicine

## 2017-08-22 VITALS — BP 112/57 | HR 80 | Wt 117.1 lb

## 2017-08-22 DIAGNOSIS — Z20828 Contact with and (suspected) exposure to other viral communicable diseases: Secondary | ICD-10-CM

## 2017-08-22 DIAGNOSIS — Z3480 Encounter for supervision of other normal pregnancy, unspecified trimester: Secondary | ICD-10-CM

## 2017-08-22 DIAGNOSIS — O09892 Supervision of other high risk pregnancies, second trimester: Secondary | ICD-10-CM

## 2017-08-22 DIAGNOSIS — N898 Other specified noninflammatory disorders of vagina: Secondary | ICD-10-CM | POA: Insufficient documentation

## 2017-08-22 DIAGNOSIS — O09212 Supervision of pregnancy with history of pre-term labor, second trimester: Secondary | ICD-10-CM

## 2017-08-22 LAB — OB RESULTS CONSOLE GC/CHLAMYDIA: Gonorrhea: NEGATIVE

## 2017-08-22 NOTE — Progress Notes (Signed)
   PRENATAL VISIT NOTE  Subjective:  Desiree Graham is a 22 y.o. G3P1102 at 4438w4d being seen today for ongoing prenatal care.  She is currently monitored for the following issues for this low-risk pregnancy and has Supervision of other normal pregnancy, antepartum; History of preterm delivery, currently pregnant in second trimester; Trichomoniasis; and Anemia in pregnancy, second trimester on their problem list.   Patient reports yellow/white vaginal discharge, no burning or itching. Patient states her sexual partner has been getting recurrent "scars" on his penis. She declines any similar symptoms.   Contractions: Not present. Vag. Bleeding: None.  Movement: Present. Denies leaking of fluid.   The following portions of the patient's history were reviewed and updated as appropriate: allergies, current medications, past family history, past medical history, past social history, past surgical history and problem list. Problem list updated.  Objective:   Vitals:   08/22/17 1504  BP: (!) 112/57  Pulse: 80  Weight: 117 lb 1.6 oz (53.1 kg)    Fetal Status: Fetal Heart Rate (bpm): 159 Fundal Height: 33 cm Movement: Present     General:  Alert, oriented and cooperative. Patient is in no acute distress.  Skin: Skin is warm and dry. No rash noted.   Cardiovascular: Normal heart rate noted  Respiratory: Normal respiratory effort, no problems with respiration noted  Abdomen: Soft, gravid, appropriate for gestational age.  Pain/Pressure: Absent     Pelvic: Cervical exam deferred       Normal external female genitalia, copious white discharge visualized. No lesions.   Extremities: Normal range of motion.  Edema: None  Mental Status:  Normal mood and affect. Normal behavior. Normal judgment and thought content.   Assessment and Plan:  Pregnancy: G3P1102 at 3838w4d  1. Supervision of other normal pregnancy, antepartum -Routine care.  2. History of preterm delivery, currently pregnant in second  trimester -No signs of preterm labor currently.  -Declines 17-p   3. Exposure to herpes simplex virus (HSV) -Checking due to concern for exposure in partner.  - HSV(herpes smplx)abs-1+2(IgG+IgM)-bld  4. Vaginal discharge -Patient treated previously for trich, then had intercourse with fiance before he was treated.  -Test for GC/chlamydia/trich.    Preterm labor symptoms and general obstetric precautions including but not limited to vaginal bleeding, contractions, leaking of fluid and fetal movement were reviewed in detail with the patient. Please refer to After Visit Summary for other counseling recommendations.  Return in about 2 weeks (around 09/05/2017).   Desiree Graham, Medical Student

## 2017-08-22 NOTE — Progress Notes (Signed)
Pt wants to make sure she still does not have trich, had sex w/ partner before he was treated ,used condom

## 2017-08-23 LAB — CERVICOVAGINAL ANCILLARY ONLY
CHLAMYDIA, DNA PROBE: NEGATIVE
Neisseria Gonorrhea: NEGATIVE
Trichomonas: NEGATIVE

## 2017-08-25 ENCOUNTER — Encounter: Payer: Self-pay | Admitting: Obstetrics and Gynecology

## 2017-08-25 DIAGNOSIS — R768 Other specified abnormal immunological findings in serum: Secondary | ICD-10-CM | POA: Insufficient documentation

## 2017-08-25 LAB — HSV(HERPES SMPLX)ABS-I+II(IGG+IGM)-BLD
HSV 2 IgG, Type Spec: 19.4 index — ABNORMAL HIGH (ref 0.00–0.90)
HSVI/II Comb IgM: 0.93 Ratio — ABNORMAL HIGH (ref 0.00–0.90)

## 2017-09-05 ENCOUNTER — Encounter: Payer: Self-pay | Admitting: Medical

## 2017-09-05 ENCOUNTER — Ambulatory Visit (INDEPENDENT_AMBULATORY_CARE_PROVIDER_SITE_OTHER): Payer: Medicaid Other | Admitting: Medical

## 2017-09-05 VITALS — BP 107/58 | HR 80 | Wt 123.4 lb

## 2017-09-05 DIAGNOSIS — O99012 Anemia complicating pregnancy, second trimester: Secondary | ICD-10-CM

## 2017-09-05 DIAGNOSIS — Z3480 Encounter for supervision of other normal pregnancy, unspecified trimester: Secondary | ICD-10-CM

## 2017-09-05 DIAGNOSIS — O09892 Supervision of other high risk pregnancies, second trimester: Secondary | ICD-10-CM

## 2017-09-05 DIAGNOSIS — O09212 Supervision of pregnancy with history of pre-term labor, second trimester: Secondary | ICD-10-CM

## 2017-09-05 DIAGNOSIS — R768 Other specified abnormal immunological findings in serum: Secondary | ICD-10-CM

## 2017-09-05 MED ORDER — VALACYCLOVIR HCL 500 MG PO TABS: 500.0000 mg | ORAL_TABLET | Freq: Two times a day (BID) | ORAL | 1 refills | Status: DC

## 2017-09-05 NOTE — Patient Instructions (Signed)

## 2017-09-05 NOTE — Progress Notes (Signed)
   PRENATAL VISIT NOTE  Subjective:  Desiree Graham is a 22 y.o. G3P1102 at 3561w4d being seen today for ongoing prenatal care.  She is currently monitored for the following issues for this high-risk pregnancy and has Supervision of other normal pregnancy, antepartum; History of preterm delivery, currently pregnant in second trimester; Trichomoniasis; Anemia in pregnancy, second trimester; and HSV-2 seropositive on their problem list.  Patient reports no complaints.  Contractions: Not present. Vag. Bleeding: None.  Movement: Present. Denies leaking of fluid.   The following portions of the patient's history were reviewed and updated as appropriate: allergies, current medications, past family history, past medical history, past social history, past surgical history and problem list. Problem list updated.  Objective:   Vitals:   09/05/17 1550  BP: (!) 107/58  Pulse: 80  Weight: 123 lb 6.4 oz (56 kg)    Fetal Status: Fetal Heart Rate (bpm): 145 Fundal Height: 36 cm Movement: Present     General:  Alert, oriented and cooperative. Patient is in no acute distress.  Skin: Skin is warm and dry. No rash noted.   Cardiovascular: Normal heart rate noted  Respiratory: Normal respiratory effort, no problems with respiration noted  Abdomen: Soft, gravid, appropriate for gestational age.  Pain/Pressure: Absent     Pelvic: Cervical exam deferred        Extremities: Normal range of motion.  Edema: None  Mental Status:  Normal mood and affect. Normal behavior. Normal judgment and thought content.   Assessment and Plan:  Pregnancy: G3P1102 at 5161w4d  1. Supervision of other normal pregnancy, antepartum  2. History of preterm delivery, currently pregnant in second trimester - Declined 17-P, no ctx today, will defer cervical exam to next visit   3. Anemia in pregnancy, second trimester - Recheck CBC at next visit - Continue PO iron as previously prescribed  4. HSV-2 seropositive - Suppressive  therapy starting at 36 weeks - valACYclovir (VALTREX) 500 MG tablet; Take 1 tablet (500 mg total) by mouth 2 (two) times daily.  Dispense: 60 tablet; Refill: 1  Preterm labor symptoms and general obstetric precautions including but not limited to vaginal bleeding, contractions, leaking of fluid and fetal movement were reviewed in detail with the patient. Please refer to After Visit Summary for other counseling recommendations.  Return in about 2 weeks (around 09/19/2017) for LOB.   Vonzella NippleJulie Isaac Dubie, PA-C

## 2017-10-01 ENCOUNTER — Inpatient Hospital Stay (HOSPITAL_COMMUNITY)
Admission: AD | Admit: 2017-10-01 | Discharge: 2017-10-02 | Disposition: A | Discharge status: Home / Self Care | Payer: Medicaid Other | Source: Ambulatory Visit | Attending: Obstetrics and Gynecology | Admitting: Obstetrics and Gynecology

## 2017-10-01 DIAGNOSIS — N898 Other specified noninflammatory disorders of vagina: Secondary | ICD-10-CM

## 2017-10-01 NOTE — MAU Note (Signed)
CTX 5-10 mins apart.  Not very painful.  No VB.  Reports good FM.  Having clear watery fluid "leak out" for past 3 days.  Hx HSV-2 Taking Valtrex.  Last outbreak 1 week ago.

## 2017-10-02 ENCOUNTER — Inpatient Hospital Stay (HOSPITAL_COMMUNITY): Payer: Medicaid Other | Admitting: Anesthesiology

## 2017-10-02 ENCOUNTER — Encounter (HOSPITAL_COMMUNITY): Payer: Self-pay | Admitting: *Deleted

## 2017-10-02 ENCOUNTER — Inpatient Hospital Stay (HOSPITAL_COMMUNITY)
Admission: AD | Admit: 2017-10-02 | Discharge: 2017-10-03 | DRG: 806 | Disposition: A | Discharge status: Home / Self Care | Payer: Medicaid Other | Source: Ambulatory Visit | Attending: Obstetrics & Gynecology | Admitting: Obstetrics & Gynecology

## 2017-10-02 DIAGNOSIS — Z87891 Personal history of nicotine dependence: Secondary | ICD-10-CM

## 2017-10-02 DIAGNOSIS — D649 Anemia, unspecified: Secondary | ICD-10-CM | POA: Diagnosis present

## 2017-10-02 DIAGNOSIS — A6000 Herpesviral infection of urogenital system, unspecified: Secondary | ICD-10-CM | POA: Diagnosis present

## 2017-10-02 DIAGNOSIS — O9902 Anemia complicating childbirth: Principal | ICD-10-CM | POA: Diagnosis present

## 2017-10-02 DIAGNOSIS — Z3A38 38 weeks gestation of pregnancy: Secondary | ICD-10-CM

## 2017-10-02 DIAGNOSIS — R109 Unspecified abdominal pain: Secondary | ICD-10-CM | POA: Diagnosis present

## 2017-10-02 DIAGNOSIS — O9832 Other infections with a predominantly sexual mode of transmission complicating childbirth: Secondary | ICD-10-CM | POA: Diagnosis present

## 2017-10-02 LAB — CBC
HCT: 32.8 % — ABNORMAL LOW (ref 36.0–46.0)
Hemoglobin: 10.2 g/dL — ABNORMAL LOW (ref 12.0–15.0)
MCH: 21.5 pg — ABNORMAL LOW (ref 26.0–34.0)
MCHC: 31.1 g/dL (ref 30.0–36.0)
MCV: 69.2 fL — ABNORMAL LOW (ref 78.0–100.0)
PLATELETS: 269 10*3/uL (ref 150–400)
RBC: 4.74 MIL/uL (ref 3.87–5.11)
RDW: 23.6 % — AB (ref 11.5–15.5)
WBC: 10.4 10*3/uL (ref 4.0–10.5)

## 2017-10-02 LAB — GC/CHLAMYDIA PROBE AMP (~~LOC~~) NOT AT ARMC
Chlamydia: NEGATIVE
NEISSERIA GONORRHEA: NEGATIVE

## 2017-10-02 LAB — POCT FERN TEST: POCT Fern Test: NEGATIVE

## 2017-10-02 LAB — WET PREP, GENITAL
Clue Cells Wet Prep HPF POC: NONE SEEN
SPERM: NONE SEEN
Trich, Wet Prep: NONE SEEN
YEAST WET PREP: NONE SEEN

## 2017-10-02 LAB — TYPE AND SCREEN
ABO/RH(D): A POS
ANTIBODY SCREEN: NEGATIVE

## 2017-10-02 LAB — ABO/RH: ABO/RH(D): A POS

## 2017-10-02 LAB — GROUP B STREP BY PCR: GROUP B STREP BY PCR: NEGATIVE

## 2017-10-02 LAB — OB RESULTS CONSOLE GBS: STREP GROUP B AG: NEGATIVE

## 2017-10-02 MED ORDER — ONDANSETRON HCL 4 MG/2ML IJ SOLN: 4.0000 mg | INTRAMUSCULAR | Status: DC | PRN

## 2017-10-02 MED ORDER — ACETAMINOPHEN 325 MG PO TABS
650.0000 mg | ORAL_TABLET | ORAL | Status: DC | PRN
  Administered 2017-10-03: 650 mg via ORAL
  Filled 2017-10-02: qty 2

## 2017-10-02 MED ORDER — EPHEDRINE 5 MG/ML INJ: 10.0000 mg | INTRAVENOUS | Status: DC | PRN

## 2017-10-02 MED ORDER — BENZOCAINE-MENTHOL 20-0.5 % EX AERO
1.0000 "application " | INHALATION_SPRAY | CUTANEOUS | Status: DC | PRN
  Filled 2017-10-02: qty 56

## 2017-10-02 MED ORDER — OXYCODONE-ACETAMINOPHEN 5-325 MG PO TABS: 2.0000 | ORAL_TABLET | ORAL | Status: DC | PRN

## 2017-10-02 MED ORDER — SOD CITRATE-CITRIC ACID 500-334 MG/5ML PO SOLN: 30.0000 mL | ORAL | Status: DC | PRN

## 2017-10-02 MED ORDER — LIDOCAINE HCL (PF) 1 % IJ SOLN
30.0000 mL | INTRAMUSCULAR | Status: DC | PRN
  Filled 2017-10-02: qty 30

## 2017-10-02 MED ORDER — ONDANSETRON HCL 4 MG/2ML IJ SOLN: 4.0000 mg | Freq: Four times a day (QID) | INTRAMUSCULAR | Status: DC | PRN

## 2017-10-02 MED ORDER — SENNOSIDES-DOCUSATE SODIUM 8.6-50 MG PO TABS
2.0000 | ORAL_TABLET | ORAL | Status: DC
  Administered 2017-10-02: 2 via ORAL
  Filled 2017-10-02: qty 2

## 2017-10-02 MED ORDER — PRENATAL MULTIVITAMIN CH
1.0000 | ORAL_TABLET | Freq: Every day | ORAL | Status: DC
  Administered 2017-10-03: 1 via ORAL
  Filled 2017-10-02: qty 1

## 2017-10-02 MED ORDER — OXYCODONE HCL 5 MG PO TABS: 5.0000 mg | ORAL_TABLET | ORAL | Status: DC | PRN

## 2017-10-02 MED ORDER — FENTANYL 2.5 MCG/ML BUPIVACAINE 1/10 % EPIDURAL INFUSION (WH - ANES)
14.0000 mL/h | INTRAMUSCULAR | Status: DC | PRN
  Administered 2017-10-02: 14 mL/h via EPIDURAL
  Filled 2017-10-02: qty 100

## 2017-10-02 MED ORDER — ONDANSETRON HCL 4 MG PO TABS: 4.0000 mg | ORAL_TABLET | ORAL | Status: DC | PRN

## 2017-10-02 MED ORDER — ACETAMINOPHEN 325 MG PO TABS: 650.0000 mg | ORAL_TABLET | ORAL | Status: DC | PRN

## 2017-10-02 MED ORDER — FERROUS SULFATE 325 (65 FE) MG PO TABS
325.0000 mg | ORAL_TABLET | Freq: Two times a day (BID) | ORAL | Status: DC
  Administered 2017-10-02 – 2017-10-03 (×2): 325 mg via ORAL
  Filled 2017-10-02 (×4): qty 1

## 2017-10-02 MED ORDER — DIPHENHYDRAMINE HCL 25 MG PO CAPS: 25.0000 mg | ORAL_CAPSULE | Freq: Four times a day (QID) | ORAL | Status: DC | PRN

## 2017-10-02 MED ORDER — PHENYLEPHRINE 40 MCG/ML (10ML) SYRINGE FOR IV PUSH (FOR BLOOD PRESSURE SUPPORT): 80.0000 ug | PREFILLED_SYRINGE | INTRAVENOUS | Status: DC | PRN

## 2017-10-02 MED ORDER — LACTATED RINGERS IV SOLN: 500.0000 mL | Freq: Once | INTRAVENOUS | Status: DC

## 2017-10-02 MED ORDER — SIMETHICONE 80 MG PO CHEW: 80.0000 mg | CHEWABLE_TABLET | ORAL | Status: DC | PRN

## 2017-10-02 MED ORDER — PHENYLEPHRINE 40 MCG/ML (10ML) SYRINGE FOR IV PUSH (FOR BLOOD PRESSURE SUPPORT)
80.0000 ug | PREFILLED_SYRINGE | INTRAVENOUS | Status: DC | PRN
  Filled 2017-10-02: qty 10

## 2017-10-02 MED ORDER — OXYTOCIN 40 UNITS IN LACTATED RINGERS INFUSION - SIMPLE MED
2.5000 [IU]/h | INTRAVENOUS | Status: DC
  Administered 2017-10-02: 2.5 [IU]/h via INTRAVENOUS
  Filled 2017-10-02: qty 1000

## 2017-10-02 MED ORDER — WITCH HAZEL-GLYCERIN EX PADS: 1.0000 "application " | MEDICATED_PAD | CUTANEOUS | Status: DC | PRN

## 2017-10-02 MED ORDER — COCONUT OIL OIL
1.0000 "application " | TOPICAL_OIL | Status: DC | PRN
  Filled 2017-10-02: qty 120

## 2017-10-02 MED ORDER — ZOLPIDEM TARTRATE 5 MG PO TABS: 5.0000 mg | ORAL_TABLET | Freq: Every evening | ORAL | Status: DC | PRN

## 2017-10-02 MED ORDER — LACTATED RINGERS IV SOLN
500.0000 mL | INTRAVENOUS | Status: DC | PRN
  Administered 2017-10-02: 1000 mL via INTRAVENOUS

## 2017-10-02 MED ORDER — OXYCODONE-ACETAMINOPHEN 5-325 MG PO TABS
1.0000 | ORAL_TABLET | ORAL | Status: DC | PRN
Start: 2017-10-02 — End: 2017-10-02

## 2017-10-02 MED ORDER — IBUPROFEN 600 MG PO TABS
600.0000 mg | ORAL_TABLET | Freq: Four times a day (QID) | ORAL | Status: DC
  Administered 2017-10-02 – 2017-10-03 (×4): 600 mg via ORAL
  Filled 2017-10-02 (×4): qty 1

## 2017-10-02 MED ORDER — TETANUS-DIPHTH-ACELL PERTUSSIS 5-2.5-18.5 LF-MCG/0.5 IM SUSP
0.5000 mL | Freq: Once | INTRAMUSCULAR | Status: DC
  Filled 2017-10-02: qty 0.5

## 2017-10-02 MED ORDER — DIPHENHYDRAMINE HCL 50 MG/ML IJ SOLN: 12.5000 mg | INTRAMUSCULAR | Status: DC | PRN

## 2017-10-02 MED ORDER — OXYTOCIN BOLUS FROM INFUSION
500.0000 mL | Freq: Once | INTRAVENOUS | Status: AC
  Administered 2017-10-02: 500 mL via INTRAVENOUS

## 2017-10-02 MED ORDER — LACTATED RINGERS IV SOLN
INTRAVENOUS | Status: DC
  Administered 2017-10-02: 10:00:00 via INTRAVENOUS

## 2017-10-02 MED ORDER — LIDOCAINE HCL (PF) 1 % IJ SOLN
INTRAMUSCULAR | Status: DC | PRN
  Administered 2017-10-02 (×2): 5 mL

## 2017-10-02 MED ORDER — DIBUCAINE 1 % RE OINT
1.0000 "application " | TOPICAL_OINTMENT | RECTAL | Status: DC | PRN
  Filled 2017-10-02: qty 28

## 2017-10-02 NOTE — Anesthesia Preprocedure Evaluation (Signed)

## 2017-10-02 NOTE — Anesthesia Procedure Notes (Signed)
Epidural Patient location during procedure: OB  Staffing Anesthesiologist: Vylette Strubel, MD Performed: anesthesiologist   Preanesthetic Checklist Completed: patient identified, site marked, surgical consent, pre-op evaluation, timeout performed, IV checked, risks and benefits discussed and monitors and equipment checked  Epidural Patient position: sitting Prep: DuraPrep Patient monitoring: heart rate, continuous pulse ox and blood pressure Approach: right paramedian Location: L4-L5 Injection technique: LOR saline  Needle:  Needle type: Tuohy  Needle gauge: 17 G Needle length: 9 cm and 9 Needle insertion depth: 5 cm Catheter type: closed end flexible Catheter size: 20 Guage Catheter at skin depth: 9 cm Test dose: negative  Assessment Events: blood not aspirated, injection not painful, no injection resistance, negative IV test and no paresthesia  Additional Notes Patient identified. Risks/Benefits/Options discussed with patient including but not limited to bleeding, infection, nerve damage, paralysis, failed block, incomplete pain control, headache, blood pressure changes, nausea, vomiting, reactions to medication both or allergic, itching and postpartum back pain. Confirmed with bedside nurse the patient's most recent platelet count. Confirmed with patient that they are not currently taking any anticoagulation, have any bleeding history or any family history of bleeding disorders. Patient expressed understanding and wished to proceed. All questions were answered. Sterile technique was used throughout the entire procedure. Please see nursing notes for vital signs. Test dose was given through epidural needle and negative prior to continuing to dose epidural or start infusion. Warning signs of high block given to the patient including shortness of breath, tingling/numbness in hands, complete motor block, or any concerning symptoms with instructions to call for help. Patient was given  instructions on fall risk and not to get out of bed. All questions and concerns addressed with instructions to call with any issues.     

## 2017-10-02 NOTE — MAU Provider Note (Addendum)
Patient Desiree HildingHasinah Gordy Graham is a 22 y.o. G3P1102 At 7288w3d here with complaints of vaginal leaking and contractions. She is concerned because she missed her last prenatal visit. She denies bleeding or decreased fetal movements. She has tried to reschedule her appt but has not been able to do so.   She denies dysuria, NV, HA, blurry vision or other ob-gyn complaints.  She has not had any recent herpes outbreaks and she is taking her valtrex twice a day.  History     CSN: [id number]  Arrival date and time: 10/01/17 2331   None     Chief Complaint  Patient presents with  . Labor Eval   Vaginal Discharge  The patient's primary symptoms include vaginal discharge. The patient's pertinent negatives include no genital itching, genital lesions or genital odor. This is a new problem. The current episode started in the past 7 days. The problem occurs intermittently. The problem has been unchanged. She is not pregnant. Associated symptoms include abdominal pain. The vaginal discharge was watery. There has been no bleeding.  She says that she has noticed increased watery discharge over the past three days. It just feel "wet" to her; she has not had to change her pad. The discharge has not soaked through her underwear.   She endorses Braxton Hicks contractions that feel like "tightening". She says that they are worse when she moves; she has not tried anything for them.   OB History    Gravida  3   Para  2   Term  1   Preterm  1   AB  0   Living  2     SAB  0   TAB  0   Ectopic  0   Multiple  0   Live Births  2           Past Medical History:  Diagnosis Date  . Anemia     Past Surgical History:  Procedure Laterality Date  . NO PAST SURGERIES      Family History  Problem Relation Age of Onset  . Miscarriages / Stillbirths Paternal Aunt     Social History   Tobacco Use  . Smoking status: Former Smoker    Last attempt to quit: 04/12/2016    Years since quitting: 1.4  .  Smokeless tobacco: Never Used  Substance Use Topics  . Alcohol use: No  . Drug use: No    Allergies: No Known Allergies  Medications Prior to Admission  Medication Sig Dispense Refill Last Dose  . ferrous sulfate 325 (65 FE) MG EC tablet Take 1 tablet (325 mg total) by mouth 3 (three) times daily with meals. 90 tablet 3 10/01/2017 at Unknown time  . Prenatal Vit-Fe Fumarate-FA (PRENATAL VITAMIN PO) Take by mouth.   10/01/2017 at Unknown time  . valACYclovir (VALTREX) 500 MG tablet Take 1 tablet (500 mg total) by mouth 2 (two) times daily. 60 tablet 1 10/01/2017 at Unknown time  . Docusate Sodium 100 MG capsule Take 1 tablet (100 mg total) by mouth 3 (three) times daily. (Patient not taking: Reported on 08/22/2017) 90 tablet 3 Not Taking    Review of Systems  HENT: Negative.   Eyes: Negative.   Respiratory: Negative.   Gastrointestinal: Positive for abdominal pain.  Genitourinary: Positive for vaginal discharge.  Musculoskeletal: Negative.   Neurological: Negative.   Psychiatric/Behavioral: Negative.    Physical Exam   Blood pressure (!) 105/57, pulse 93, temperature 98 F (36.7 C), resp. rate 17, height  5\' 2"  (1.575 m), weight 123 lb 12 oz (56.1 kg), last menstrual period 01/05/2017, SpO2 100 %.  Physical Exam  Constitutional: She appears well-developed.  HENT:  Head: Normocephalic.  Eyes: Pupils are equal, round, and reactive to light.  Neck: Normal range of motion.  Respiratory: Effort normal.  GI: Soft.  Genitourinary: Vagina normal.  Genitourinary Comments: Normal external female genitalia; yellow discharge present in the vagina; no pooling. Cervix is long, externally 2 cm but internal os is closed. No CMT, suprapubic or adnexal tenderness.   Musculoskeletal: Normal range of motion.  Neurological: She is alert.  Skin: Skin is warm and dry.  Psychiatric: She has a normal mood and affect.    MAU Course  Procedures  MDM GBS pending  GC CT  pending Wet prep:  negative Sterile fern is negative NST: 135 bpm mod var, present acel, no decels, irregular contractions.  Fern negative  Cervix unchanged after 1 hour in MAU.  Assessment and Plan   1. Vaginal discharge    2. Patient stable for discharge. Message sent to clinic to schedule patient for ob appt this week.   3.Warning signs and return precautions reviewed. Patient verbalized understanding.     Charlesetta Garibaldi Pedro Whiters 10/02/2017, 1:01 AM

## 2017-10-02 NOTE — Discharge Instructions (Signed)
Braxton Hicks Contractions °Contractions of the uterus can occur throughout pregnancy, but they are not always a sign that you are in labor. You may have practice contractions called Braxton Hicks contractions. These false labor contractions are sometimes confused with true labor. °What are Braxton Hicks contractions? °Braxton Hicks contractions are tightening movements that occur in the muscles of the uterus before labor. Unlike true labor contractions, these contractions do not result in opening (dilation) and thinning of the cervix. Toward the end of pregnancy (32-34 weeks), Braxton Hicks contractions can happen more often and may become stronger. These contractions are sometimes difficult to tell apart from true labor because they can be very uncomfortable. You should not feel embarrassed if you go to the hospital with false labor. °Sometimes, the only way to tell if you are in true labor is for your health care provider to look for changes in the cervix. The health care provider will do a physical exam and may monitor your contractions. If you are not in true labor, the exam should show that your cervix is not dilating and your water has not broken. °If there are other health problems associated with your pregnancy, it is completely safe for you to be sent home with false labor. You may continue to have Braxton Hicks contractions until you go into true labor. °How to tell the difference between true labor and false labor °True labor °· Contractions last 30-70 seconds. °· Contractions become very regular. °· Discomfort is usually felt in the top of the uterus, and it spreads to the lower abdomen and low back. °· Contractions do not go away with walking. °· Contractions usually become more intense and increase in frequency. °· The cervix dilates and gets thinner. °False labor °· Contractions are usually shorter and not as strong as true labor contractions. °· Contractions are usually irregular. °· Contractions  are often felt in the front of the lower abdomen and in the groin. °· Contractions may go away when you walk around or change positions while lying down. °· Contractions get weaker and are shorter-lasting as time goes on. °· The cervix usually does not dilate or become thin. °Follow these instructions at home: °· Take over-the-counter and prescription medicines only as told by your health care provider. °· Keep up with your usual exercises and follow other instructions from your health care provider. °· Eat and drink lightly if you think you are going into labor. °· If Braxton Hicks contractions are making you uncomfortable: °? Change your position from lying down or resting to walking, or change from walking to resting. °? Sit and rest in a tub of warm water. °? Drink enough fluid to keep your urine pale yellow. Dehydration may cause these contractions. °? Do slow and deep breathing several times an hour. °· Keep all follow-up prenatal visits as told by your health care provider. This is important. °Contact a health care provider if: °· You have a fever. °· You have continuous pain in your abdomen. °Get help right away if: °· Your contractions become stronger, more regular, and closer together. °· You have fluid leaking or gushing from your vagina. °· You pass blood-tinged mucus (bloody show). °· You have bleeding from your vagina. °· You have low back pain that you never had before. °· You feel your baby’s head pushing down and causing pelvic pressure. °· Your baby is not moving inside you as much as it used to. °Summary °· Contractions that occur before labor are called Braxton   Hicks contractions, false labor, or practice contractions. °· Braxton Hicks contractions are usually shorter, weaker, farther apart, and less regular than true labor contractions. True labor contractions usually become progressively stronger and regular and they become more frequent. °· Manage discomfort from Braxton Hicks contractions by  changing position, resting in a warm bath, drinking plenty of water, or practicing deep breathing. °This information is not intended to replace advice given to you by your health care provider. Make sure you discuss any questions you have with your health care provider. °Document Released: 10/12/2016 Document Revised: 10/12/2016 Document Reviewed: 10/12/2016 °Elsevier Interactive Patient Education © 2018 [employer] ° °

## 2017-10-02 NOTE — H&P (Addendum)
LABOR AND DELIVERY ADMISSION HISTORY AND PHYSICAL NOTE  Desiree Graham is a 22 y.o. female [phone] with IUP at [redacted]w[redacted]d by LMP presenting for increasing abdominal pain and contractions, with SROM in triage. Mom reports being a carrier of sickle cell trait. History further complicated by history of PTD, seropositive HSV-2 on valtrex at 36 weeks.   Pregnancy complicated by possible HSV exposure, started on valtrex starting at 36 weeks. No confirmatory testing or swab available, but patient was seropositive for HSV-2. No prodromal symptoms. One possible lesion vs folliculitis during pregnancy that was not evaluated by healthcare professional. No active lesions at this time.   She reports positive fetal movement. She now has leakage of fluid without vaginal bleeding.  Prenatal History/Complications:  Past Medical History: Past Medical History:  Diagnosis Date  . Anemia     Past Surgical History: Past Surgical History:  Procedure Laterality Date  . NO PAST SURGERIES      Obstetrical History: OB History    Gravida  3   Para  2   Term  1   Preterm  1   AB  0   Living  2     SAB  0   TAB  0   Ectopic  0   Multiple  0   Live Births  2           Social History: Social History   Socioeconomic History  . Marital status: Single    Spouse name: Not on file  . Number of children: Not on file  . Years of education: Not on file  . Highest education level: Not on file  Occupational History  . Not on file  Social Needs  . Financial resource strain: Not on file  . Food insecurity:    Worry: Not on file    Inability: Not on file  . Transportation needs:    Medical: Not on file    Non-medical: Not on file  Tobacco Use  . Smoking status: Former Smoker    Last attempt to quit: 04/12/2016    Years since quitting: 1.4  . Smokeless tobacco: Never Used  Substance and Sexual Activity  . Alcohol use: No  . Drug use: No  . Sexual activity: Yes    Birth control/protection:  None  Lifestyle  . Physical activity:    Days per week: Not on file    Minutes per session: Not on file  . Stress: Not on file  Relationships  . Social connections:    Talks on phone: Not on file    Gets together: Not on file    Attends religious service: Not on file    Active member of club or organization: Not on file    Attends meetings of clubs or organizations: Not on file    Relationship status: Not on file  Other Topics Concern  . Not on file  Social History Narrative  . Not on file    Family History: Family History  Problem Relation Age of Onset  . Miscarriages / Stillbirths Paternal Aunt     Allergies: No Known Allergies  Medications Prior to Admission  Medication Sig Dispense Refill Last Dose  . ferrous sulfate 325 (65 FE) MG EC tablet Take 1 tablet (325 mg total) by mouth 3 (three) times daily with meals. 90 tablet 3 10/01/2017 at Unknown time  . Prenatal Vit-Fe Fumarate-FA (PRENATAL VITAMIN PO) Take by mouth.   10/01/2017 at Unknown time  . valACYclovir (VALTREX) 500 MG tablet  Take 1 tablet (500 mg total) by mouth 2 (two) times daily. 60 tablet 1 10/01/2017 at Unknown time  . Docusate Sodium 100 MG capsule Take 1 tablet (100 mg total) by mouth 3 (three) times daily. (Patient not taking: Reported on 08/22/2017) 90 tablet 3 Not Taking     Review of Systems   All systems reviewed and negative except as stated in HPI  Blood pressure 97/77, pulse 71, temperature 98.6 F (37 C), temperature source Oral, resp. rate 18, height 5\' 2"  (1.575 m), weight 123 lb (55.8 kg), last menstrual period 01/05/2017, SpO2 98 %. General appearance: alert, cooperative and no distress Lungs: clear to auscultation bilaterally Heart: regular rate and rhythm Abdomen: soft, non-tender; bowel sounds normal Extremities: No calf swelling or tenderness Presentation: cephalic Fetal monitoring: baseline 140, +accels, early decelerations, moderate variability Uterine activity: contractions  q2-26min Dilation: 7 Effacement (%): 100 Station: 0 Exam by:: Santina Evans stout RN   Prenatal labs: ABO, Rh: --/--/A POS (04/23 1610) Antibody: NEG (04/23 9604) Rubella: 18.00 (11/01 1408) RPR: Non Reactive (02/21 1338)  HBsAg: Negative (11/01 1408)  HIV: Non Reactive (02/21 1338)  GBS:   PCR pending 1 hr Glucola: normal (66) Genetic screening:  declined Anatomy US: normal; girl  Prenatal Transfer Tool  Maternal Diabetes: No Genetic Screening: Declined Maternal Ultrasounds/Referrals: Normal Fetal Ultrasounds or other Referrals:  None Maternal Substance Abuse:  No Significant Maternal Medications:  None Significant Maternal Lab Results: Lab values include: Group B Strep negative  Results for orders placed or performed during the hospital encounter of 10/02/17 (from the past 24 hour(s))  Type and screen [facility]   Collection Time: 10/02/17  6:40 AM  Result Value Ref Range   ABO/RH(D) A POS    Antibody Screen NEG    Sample Expiration      10/05/2017 Performed at [facility], [address] [place], [place] [address]   CBC   Collection Time: 10/02/17  6:50 AM  Result Value Ref Range   WBC 10.4 4.0 - 10.5 K/uL   RBC 4.74 3.87 - 5.11 MIL/uL   Hemoglobin 10.2 (L) 12.0 - 15.0 g/dL   HCT 11.9 (L) 14.7 - 82.9 %   MCV 69.2 (L) 78.0 - 100.0 fL   MCH 21.5 (L) 26.0 - 34.0 pg   MCHC 31.1 30.0 - 36.0 g/dL   RDW 56.2 (H) 13.0 - 86.5 %   Platelets 269 150 - 400 K/uL  Results for orders placed or performed during the hospital encounter of 10/01/17 (from the past 24 hour(s))  Wet prep, genital   Collection Time: 10/02/17  1:13 AM  Result Value Ref Range   Yeast Wet Prep HPF POC NONE SEEN NONE SEEN   Trich, Wet Prep NONE SEEN NONE SEEN   Clue Cells Wet Prep HPF POC NONE SEEN NONE SEEN   WBC, Wet Prep HPF POC MODERATE (A) NONE SEEN   Sperm NONE SEEN   Fern Test   Collection Time: 10/02/17  2:16 AM  Result Value Ref Range   POCT Fern Test Negative =  intact amniotic membranes     Patient Active Problem List   Diagnosis Date Noted  . Indication for care in labor and delivery, antepartum 10/02/2017  . HSV-2 seropositive 08/25/2017  . Trichomoniasis 04/17/2017  . Anemia in pregnancy, second trimester 04/17/2017  . Supervision of other normal pregnancy, antepartum 04/12/2017  . History of preterm delivery, currently pregnant in second trimester 04/12/2017    Assessment: Apoorva Finfrock is a  22 y.o. [id number] at 7838w3d here for increasing abdominal pain and contractions, with SROM in triage. Mom reports being a carrier of sickle cell trait. History further complicated by history of PTD, seropositive HSV-2 on valtrex at 36 weeks.  #Labor: patient in active labor. Continue with expectant management.  #Pain: Epidural in place #FWB: Cat I- reassuring #ID:  GBS unknown. GBS PCR pending #MOF: breast #MOC:IUD #Circ:  n/a  Lise Auerampbell, Megan C, MD PGY-2 10/02/2017, 9:24 AM   CNM attestation:  I have seen and examined this patient; I agree with above documentation in the resident's note.   Elon SpannerHasinah Bulnes is a 22 y.o. [phone] here for SOL; no prodromal HSV 2 s/s  PE: BP 128/69   Pulse 68   Temp 97.8 F (36.6 C) (Oral)   Resp 18   Ht 5\' 2"  (1.575 m)   Wt 55.8 kg (123 lb)   LMP 01/05/2017   SpO2 98%   BMI 22.50 kg/m   Resp: normal effort, no distress Abd: gravid  ROS, labs, PMH reviewed  [facility]: [facility] GBS PCR neg Anticipate SVD  SHAW, KIMBERLY CNM 10/02/2017, 12:50 PM

## 2017-10-02 NOTE — Progress Notes (Addendum)
Faculty Note  Please see H&P for further details.  Patient is 22 yo [id number] @ 5056w3d who presented for contractions and noted to SROM in triage.   In to review course with patient, she reports that [facility] herpes for lesion on penis but he did not get swabbed due to lack of concern by providers. 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 She reports a possible lesion 3 weeks ago (late March, beginning of April) that she thought was a hair follicle or pimple but was not seen at that time or swabbed. At her next appointment, labwork was reviewed, noted to be IgG positive and subsequently started on valtrex at 36 weeks (2 weeks ago). She currently has no prodromal symptoms and per the CNM, there are no lesions noted on her vaginal exam.   Reviewed the above with the patient and reviewed the risks of HSV transmission to baby are low given valtrex prophylaxis, no current lesions or symptoms. Reviewed that it is unclear if the bump she had 3 weeks ago was a lesion. 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 Given the above, I believe it is reasonable to proceed with vaginal delivery as she has no symptoms or lesions and has been on valtrex. She is comfortable with risk and would also like to proceed with vaginal delivery. Will admit for labor.    Baldemar LenisK. Meryl Davis, M.D. Attending Obstetrician & Gynecologist, [facility] for [facility], [facility]

## 2017-10-03 ENCOUNTER — Other Ambulatory Visit: Payer: Self-pay

## 2017-10-03 LAB — RPR: RPR Ser Ql: NONREACTIVE

## 2017-10-03 MED ORDER — IBUPROFEN 600 MG PO TABS: 600.0000 mg | ORAL_TABLET | Freq: Four times a day (QID) | ORAL | 0 refills | Status: DC

## 2017-10-03 NOTE — Anesthesia Postprocedure Evaluation (Signed)
Anesthesia Post Note  Patient: Desiree Graham  Procedure(s) Performed: AN AD HOC LABOR EPIDURAL     Patient location during evaluation: Mother Baby Anesthesia Type: Epidural Level of consciousness: awake and oriented Pain management: pain level controlled Vital Signs Assessment: post-procedure vital signs reviewed and stable Respiratory status: spontaneous breathing Cardiovascular status: blood pressure returned to baseline Postop Assessment: no headache, no backache, epidural receding, patient able to bend at knees, no apparent nausea or vomiting and adequate PO intake Anesthetic complications: no    Last Vitals:  Vitals:   10/02/17 1815 10/03/17 0500  BP: 105/69 107/60  Pulse: 62 (!) 55  Resp: 18   Temp: 36.8 C 36.6 C  SpO2: 98%     Last Pain:  Vitals:   10/03/17 0540  TempSrc:   PainSc: 2    Pain Goal:                 Cleda ClarksBrowder, Davida Falconi R

## 2017-10-03 NOTE — Discharge Instructions (Signed)
Postpartum Care After Vaginal Delivery °The period of time right after you deliver your newborn is called the postpartum period. °What kind of medical care will I receive? °· You may continue to receive fluids and medicines through an IV tube inserted into one of your veins. °· If an incision was made near your vagina (episiotomy) or if you had some vaginal tearing during delivery, cold compresses may be placed on your episiotomy or your tear. This helps to reduce pain and swelling. °· You may be given a squirt bottle to use when you go to the bathroom. You may use this until you are comfortable wiping as usual. To use the squirt bottle, follow these steps: °? Before you urinate, fill the squirt bottle with warm water. Do not use hot water. °? After you urinate, while you are sitting on the toilet, use the squirt bottle to rinse the area around your urethra and vaginal opening. This rinses away any urine and blood. °? You may do this instead of wiping. As you start healing, you may use the squirt bottle before wiping yourself. Make sure to wipe gently. °? Fill the squirt bottle with clean water every time you use the bathroom. °· You will be given sanitary pads to wear. °How can I expect to feel? °· You may not feel the need to urinate for several hours after delivery. °· You will have some soreness and pain in your abdomen and vagina. °· If you are breastfeeding, you may have uterine contractions every time you breastfeed for up to several weeks postpartum. Uterine contractions help your uterus return to its normal size. °· It is normal to have vaginal bleeding (lochia) after delivery. The amount and appearance of lochia is often similar to a menstrual period in the first week after delivery. It will gradually decrease over the next few weeks to a dry, yellow-brown discharge. For most women, lochia stops completely by 6-8 weeks after delivery. Vaginal bleeding can vary from woman to woman. °· Within the first few  days after delivery, you may have breast engorgement. This is when your breasts feel heavy, full, and uncomfortable. Your breasts may also throb and feel hard, tightly stretched, warm, and tender. After this occurs, you may have milk leaking from your breasts. Your health care provider can help you relieve discomfort due to breast engorgement. Breast engorgement should go away within a few days. °· You may feel more sad or worried than normal due to hormonal changes after delivery. These feelings should not last more than a few days. If these feelings do not go away after several days, speak with your health care provider. °How should I care for myself? °· Tell your health care provider if you have pain or discomfort. °· Drink enough water to keep your urine clear or pale yellow. °· Wash your hands thoroughly with soap and water for at least 20 seconds after changing your sanitary pads, after using the toilet, and before holding or feeding your baby. °· If you are not breastfeeding, avoid touching your breasts a lot. Doing this can make your breasts produce more milk. °· If you become weak or lightheaded, or you feel like you might faint, ask for help before: °? Getting out of bed. °? Showering. °· Change your sanitary pads frequently. Watch for any changes in your flow, such as a sudden increase in volume, a change in color, the passing of large blood clots. If you pass a blood clot from your vagina, save it   to show to your health care provider. Do not flush blood clots down the toilet without having your health care provider look at them. °· Make sure that all your vaccinations are up to date. This can help protect you and your baby from getting certain diseases. You may need to have immunizations done before you leave the hospital. °· If desired, talk with your health care provider about methods of family planning or birth control (contraception). °How can I start bonding with my baby? °Spending as much time as  possible with your baby is very important. During this time, you and your baby can get to know each other and develop a bond. Having your baby stay with you in your room (rooming in) can give you time to get to know your baby. Rooming in can also help you become comfortable caring for your baby. Breastfeeding can also help you bond with your baby. °How can I plan for returning home with my baby? °· Make sure that you have a car seat installed in your vehicle. °? Your car seat should be checked by a certified car seat installer to make sure that it is installed safely. °? Make sure that your baby fits into the car seat safely. °· Ask your health care provider any questions you have about caring for yourself or your baby. Make sure that you are able to contact your health care provider with any questions after leaving the hospital. °This information is not intended to replace advice given to you by your health care provider. Make sure you discuss any questions you have with your health care provider. °Document Released: 03/26/2007 Document Revised: 11/01/2015 Document Reviewed: 05/03/2015 °Elsevier Interactive Patient Education © 2018 Elsevier Inc. ° °

## 2017-10-03 NOTE — Discharge Summary (Addendum)
OB Discharge Summary     Patient Name: Desiree Graham DOB: 14-Nov-1995 MRN: [id number]  Date of admission: 10/02/2017 Delivering MD: Cam Hai D   Date of discharge: 10/03/2017  Admitting diagnosis: 39 WEEKS CTX EXTREME PRESSURE Intrauterine pregnancy: [redacted]w[redacted]d     Secondary diagnosis:  Active Problems:   Indication for care in labor and delivery, antepartum  Additional problems: Anemia (Hgb 10.2)     Discharge diagnosis: Term Pregnancy Delivered                                                                                                Post partum procedures:None  Augmentation: None  Complications: None  Hospital course:  Onset of Labor With Vaginal Delivery     22 y.o. yo [id number] at [redacted]w[redacted]d was admitted in Active Labor on 10/02/2017. Patient had an uncomplicated labor course as follows:  Membrane Rupture Time/Date: 6:25 AM ,10/02/2017   Intrapartum Procedures: Episiotomy: None [1]                                         Lacerations:  None [1]  Patient had a delivery of a Viable infant. 10/02/2017  Information for the patient's newborn:  Bristyl, Mclees [[id number]]  Delivery Method: Vaginal, Spontaneous(Filed from Delivery Summary)    Pateint had an uncomplicated postpartum course.  She is ambulating, tolerating a regular diet, passing flatus, and urinating well. Patient is discharged home in stable condition on 10/03/17.   Physical exam  Vitals:   10/02/17 1335 10/02/17 1435 10/02/17 1815 10/03/17 0500  BP: 113/69 104/60 105/69 107/60  Pulse: 62 69 62 (!) 55  Resp: 18 18 18    Temp: 98.2 F (36.8 C) 97.7 F (36.5 C) 98.3 F (36.8 C) 97.9 F (36.6 C)  TempSrc: Oral Oral Oral Oral  SpO2:   98%   Weight:      Height:       General: alert, cooperative and no distress Lochia: appropriate Uterine Fundus: firm Incision: N/A DVT Evaluation: No evidence of DVT seen on physical exam. Labs: Lab Results  Component Value Date   WBC 10.4 10/02/2017   HGB 10.2  (L) 10/02/2017   HCT 32.8 (L) 10/02/2017   MCV 69.2 (L) 10/02/2017   PLT 269 10/02/2017   No flowsheet data found.  Discharge instruction: per After Visit Summary and "Baby and Me Booklet".  After visit meds:  Allergies as of 10/03/2017   No Known Allergies     Medication List    TAKE these medications   ferrous sulfate 325 (65 FE) MG tablet Take by mouth.   ibuprofen 600 MG tablet Commonly known as:  ADVIL,MOTRIN Take 1 tablet (600 mg total) by mouth every 6 (six) hours.   prenatal multivitamin Tabs tablet Take 1 tablet by mouth daily at 12 noon.       Diet: routine diet  Activity: Advance as tolerated. Pelvic rest for 6 weeks.   Outpatient follow up:4 weeks Follow up Appt: Future Appointments  Date Time  Provider Department Center  11/07/2017  9:15 AM Marny LowensteinWenzel, Julie N, PA-C [facility]   Follow up Visit:No follow-ups on file.  Postpartum contraception: IUD Mirena  Newborn Data: Live born female  Birth Weight: 6 lb 5.8 oz (2885 g) APGAR: 8, 9  Newborn Delivery   Birth date/time:  10/02/2017 11:57:00 Delivery type:  Vaginal, Spontaneous     Baby Feeding: Breast Disposition:home with mother   10/03/2017 Gorden HarmsMegan Campbell, MD  OB FELLOW DISCHARGE ATTESTATION  I have seen and examined this patient. I agree with above documentation and have made edits as needed.   Caryl AdaJazma Avyukth Bontempo, DO OB Fellow 1:25 PM

## 2017-10-04 LAB — CULTURE, BETA STREP (GROUP B ONLY)

## 2017-11-07 ENCOUNTER — Encounter: Payer: Self-pay | Admitting: Medical

## 2017-11-07 ENCOUNTER — Ambulatory Visit (INDEPENDENT_AMBULATORY_CARE_PROVIDER_SITE_OTHER): Payer: Medicaid Other | Admitting: Medical

## 2017-11-07 DIAGNOSIS — Z3202 Encounter for pregnancy test, result negative: Secondary | ICD-10-CM

## 2017-11-07 DIAGNOSIS — Z1389 Encounter for screening for other disorder: Secondary | ICD-10-CM

## 2017-11-07 DIAGNOSIS — Z8751 Personal history of pre-term labor: Secondary | ICD-10-CM | POA: Insufficient documentation

## 2017-11-07 LAB — POCT PREGNANCY, URINE: PREG TEST UR: NEGATIVE

## 2017-11-07 NOTE — Patient Instructions (Signed)
Intrauterine Device Information An intrauterine device (IUD) is inserted into your uterus to prevent pregnancy. There are two types of IUDs available:  Copper IUD-This type of IUD is wrapped in copper wire and is placed inside the uterus. Copper makes the uterus and fallopian tubes produce a fluid that kills sperm. The copper IUD can stay in place for 10 years.  Hormone IUD-This type of IUD contains the hormone progestin (synthetic progesterone). The hormone thickens the cervical mucus and prevents sperm from entering the uterus. It also thins the uterine lining to prevent implantation of a fertilized egg. The hormone can weaken or kill the sperm that get into the uterus. One type of hormone IUD can stay in place for 5 years, and another type can stay in place for 3 years.  Your health care provider will make sure you are a good candidate for a contraceptive IUD. Discuss with your health care provider the possible side effects. Advantages of an intrauterine device  IUDs are highly effective, reversible, long acting, and low maintenance.  There are no estrogen-related side effects.  An IUD can be used when breastfeeding.  IUDs are not associated with weight gain.  The copper IUD works immediately after insertion.  The hormone IUD works right away if inserted within 7 days of your period starting. You will need to use a backup method of birth control for 7 days if the hormone IUD is inserted at any other time in your cycle.  The copper IUD does not interfere with your female hormones.  The hormone IUD can make heavy menstrual periods lighter and decrease cramping.  The hormone IUD can be used for 3 or 5 years.  The copper IUD can be used for 10 years. Disadvantages of an intrauterine device  The hormone IUD can be associated with irregular bleeding patterns.  The copper IUD can make your menstrual flow heavier and more painful.  You may experience cramping and vaginal bleeding after  insertion. This information is not intended to replace advice given to you by your health care provider. Make sure you discuss any questions you have with your health care provider. Document Released: 05/02/2004 Document Revised: 11/04/2015 Document Reviewed: 11/17/2012 Elsevier Interactive Patient Education  2017 Elsevier Inc. IUD PLACEMENT POST-PROCEDURE INSTRUCTIONS  1. You may take Ibuprofen, Aleve or Tylenol for pain if needed.  Cramping should resolve within in 24 hours.  2. You may have a small amount of spotting.  You should wear a mini pad for the next few days.  3. You may have intercourse after 24 hours.  If you using this for birth control, it is effective immediately.  4. You need to call if you have any pelvic pain, fever, heavy bleeding or foul smelling vaginal discharge.  Irregular bleeding is common the first several months after having an IUD placed. You do not need to call for this reason unless you are concerned.  5. Shower or bathe as normal  6. You should have a follow-up appointment in 4-8 weeks for a re-check to make sure you are not having any problems. 

## 2017-11-07 NOTE — Progress Notes (Signed)
Subjective:     Desiree Graham is a 22 y.o. female who presents for a postpartum visit. She is 5 weeks postpartum following a spontaneous vaginal delivery. I have fully reviewed the prenatal and intrapartum course. The delivery was at 38 gestational weeks. Outcome: spontaneous vaginal delivery. Anesthesia: epidural. Postpartum course has been normal. Baby's course has been normal. Baby is feeding by bottle Rush Barer Good start. Bleeding no bleeding. Bowel function is normal. Bladder function is normal. Patient is not sexually active. Contraception method is abstinence. Postpartum depression screening: negative.  The following portions of the patient's history were reviewed and updated as appropriate: allergies, current medications, past family history, past medical history, past social history, past surgical history and problem list.  Review of Systems Pertinent items are noted in HPI.   Objective:    BP 117/67   Pulse 79   Ht  (1.575 m)   Wt 106 lb (48.1 kg)   Breastfeeding? No   BMI 19.39 kg/m   General:  alert and cooperative   Breasts:  not evaluated  Lungs: clear to auscultation bilaterally  Heart:  regular rate and rhythm, S1, S2 normal, no murmur, click, rub or gallop  Abdomen: soft, non-tender; bowel sounds normal; no masses,  no organomegaly   Vulva:  normal  Vagina: normal vagina, no discharge, exudate, lesion, or erythema  Cervix:  normal contour, no lesions  Corpus: not examined  Adnexa:  not evaluated  Rectal Exam: Not performed.         Results for orders placed or performed in visit on 11/07/17 (from the past 24 hour(s))  Pregnancy, urine POC     Status: None   Collection Time: 11/07/17  9:52 AM  Result Value Ref Range   Preg Test, Ur NEGATIVE NEGATIVE    GYNECOLOGY CLINIC PROCEDURE NOTE  IUD Insertion Procedure Note Patient identified, informed consent performed.  Discussed risks of irregular bleeding, cramping, infection, malpositioning or misplacement of  the IUD outside the uterus which may require further procedure such as laparoscopy. Time out was performed.  Urine pregnancy test negative.  Speculum placed in the vagina.  Cervix visualized.  Cleaned with Betadine x 2.  Grasped anteriorly with a single tooth tenaculum. Patient decided to stop insertion procedure due to discomfort of speculum. Will plan to attempt again in ~ 2 weeks. Condom use or abstinence advised.   Assessment:     Normal postpartum exam. Pap smear not done at today's visit. Last pap smear was normal 2018.  Plan:    1. Contraception: abstinence or condoms until IUD insertion  2. Follow up in: 2 weeks for IUD insertion or sooner as needed.    Marny Lowenstein, PA-C 11/07/2017 10:04 AM

## 2017-11-21 ENCOUNTER — Ambulatory Visit: Payer: Medicaid Other | Admitting: Medical

## 2018-03-25 ENCOUNTER — Ambulatory Visit (INDEPENDENT_AMBULATORY_CARE_PROVIDER_SITE_OTHER): Payer: Medicaid Other | Admitting: General Practice

## 2018-03-25 ENCOUNTER — Encounter: Payer: Self-pay | Admitting: Family Medicine

## 2018-03-25 DIAGNOSIS — Z3201 Encounter for pregnancy test, result positive: Secondary | ICD-10-CM | POA: Diagnosis present

## 2018-03-25 LAB — POCT PREGNANCY, URINE: Preg Test, Ur: POSITIVE — AB

## 2018-03-25 NOTE — Progress Notes (Signed)
Patient presents to office today for UPT. UPT +. Patient reports first positive home test around 02/20/18. LMP 01/12/18 EDD 10/19/18 [redacted]w[redacted]d today. Patient denies taking any meds or vitamins. Recommended she begin prenatal vitamins and start OB care. Patient verbalized understanding & had no questions.

## 2018-03-27 NOTE — Progress Notes (Signed)
Agree with A & P. 

## 2018-04-09 ENCOUNTER — Encounter: Payer: Medicaid Other | Admitting: Advanced Practice Midwife

## 2018-04-17 ENCOUNTER — Ambulatory Visit: Payer: Medicaid Other | Admitting: Clinical

## 2018-04-17 ENCOUNTER — Encounter: Payer: Self-pay | Admitting: Family Medicine

## 2018-04-17 ENCOUNTER — Other Ambulatory Visit (HOSPITAL_COMMUNITY)
Admission: RE | Admit: 2018-04-17 | Discharge: 2018-04-17 | Disposition: A | Discharge status: Home / Self Care | Payer: Medicaid Other | Source: Ambulatory Visit | Attending: Family Medicine | Admitting: Family Medicine

## 2018-04-17 ENCOUNTER — Ambulatory Visit (INDEPENDENT_AMBULATORY_CARE_PROVIDER_SITE_OTHER): Payer: Medicaid Other | Admitting: Family Medicine

## 2018-04-17 ENCOUNTER — Encounter: Payer: Medicaid Other | Admitting: Family Medicine

## 2018-04-17 VITALS — BP 103/63 | HR 80 | Wt 92.2 lb

## 2018-04-17 DIAGNOSIS — O0990 Supervision of high risk pregnancy, unspecified, unspecified trimester: Secondary | ICD-10-CM

## 2018-04-17 DIAGNOSIS — O0991 Supervision of high risk pregnancy, unspecified, first trimester: Secondary | ICD-10-CM

## 2018-04-17 DIAGNOSIS — O09211 Supervision of pregnancy with history of pre-term labor, first trimester: Secondary | ICD-10-CM

## 2018-04-17 DIAGNOSIS — D573 Sickle-cell trait: Secondary | ICD-10-CM

## 2018-04-17 DIAGNOSIS — Z8751 Personal history of pre-term labor: Secondary | ICD-10-CM

## 2018-04-17 DIAGNOSIS — O99011 Anemia complicating pregnancy, first trimester: Secondary | ICD-10-CM

## 2018-04-17 LAB — POCT URINALYSIS DIP (DEVICE)
Bilirubin Urine: NEGATIVE
Glucose, UA: NEGATIVE mg/dL
Hgb urine dipstick: NEGATIVE
Ketones, ur: 15 mg/dL — AB
LEUKOCYTES UA: NEGATIVE
NITRITE: NEGATIVE
PROTEIN: NEGATIVE mg/dL
Specific Gravity, Urine: 1.02 (ref 1.005–1.030)
UROBILINOGEN UA: 0.2 mg/dL (ref 0.0–1.0)
pH: 6 (ref 5.0–8.0)

## 2018-04-17 NOTE — BH Specialist Note (Signed)
Integrated Behavioral Health Initial Visit  MRN: [id number] Name: Desiree Graham  Number of Integrated Behavioral Health Clinician visits:: 1/6 Session Start time: 4:22  Session End time: 4:29 Total time: 15 minutes  Type of Service: Integrated Behavioral Health- Individual/Family Interpretor:No. Interpretor Name and Language: n/a   Warm Hand Off Completed.       SUBJECTIVE: Desiree Graham is a 22 y.o. female accompanied by n/a Patient was referred by Tinnie Gens, MD for Initial OB introduction to integrated behavioral health services . Patient reports the following symptoms/concerns: Pt states no particular concerns today.  Duration of problem: n/a; Severity of problem: n/a  OBJECTIVE: Mood: Normal and Affect: Appropriate Risk of harm to self or others: No plan to harm self or others  LIFE CONTEXT: Family and Social: - School/Work: - Self-Care: - Life Changes: Current pregnancy  GOALS ADDRESSED: Patient will: 1. Increase knowledge and/or ability of: healthy habits   INTERVENTIONS:  Standardized Assessments completed: GAD-7 and PHQ 9  ASSESSMENT: Patient currently experiencing Supervision of high risk pregnancy, antepartum.   Patient may benefit from Initial OB introduction to integrated behavioral health services .  PLAN: 1. Follow up with behavioral health clinician on : As needed 2. Behavioral recommendations:  -Continue taking prenatal vitamin, as recommended by medical provider 3. Referral(s): Integrated [employer] (In Clinic) 4. "From scale of 1-10, how likely are you to follow plan?": 10  Rae Lips, LCSW  Depression screen [facility] 2/9 04/17/2018 04/12/2017  Decreased Interest 0 0  Down, Depressed, Hopeless 0 0  PHQ - 2 Score 0 0  Altered sleeping 0 0  Tired, decreased energy 1 1  Change in appetite 1 0  Feeling bad or failure about yourself  0 0  Trouble concentrating 0 0  Moving slowly or fidgety/restless 0 0  Suicidal thoughts  0 0  PHQ-9 Score 2 1   GAD 7 : Generalized Anxiety Score 04/17/2018 04/12/2017  Nervous, Anxious, on Edge 0 0  Control/stop worrying 0 0  Worry too much - different things 0 0  Trouble relaxing 0 0  Restless 0 0  Easily annoyed or irritable 0 1  Afraid - awful might happen 0 0  Total GAD 7 Score 0 1

## 2018-04-17 NOTE — Patient Instructions (Signed)

## 2018-04-17 NOTE — Progress Notes (Signed)
Subjective:   Desiree Graham is a 22 y.o. [id number] at [redacted]w[redacted]d by LMP being seen today for her first obstetrical visit.  Her obstetrical history is significant for h/o preterm birth. Patient does not intend to breast feed. Pregnancy history fully reviewed.  Patient reports nausea.  HISTORY: OB History  Gravida Para Term Preterm AB Living  4 3 2 1  0 3  SAB TAB Ectopic Multiple Live Births  0 0 0 0 3    # Outcome Date GA Lbr Len/2nd Weight Sex Delivery Anes PTL Lv  4 Current           3 Term 10/02/17 [redacted]w[redacted]d 06:42 / 00:15 6 lb 5.8 oz (2.885 kg) F Vag-Spont EPI  LIV     Name: MELLANIE, BEJARANO     Apgar1: 8  Apgar5: 9  2 Preterm 11/23/16 [redacted]w[redacted]d  5 lb 13 oz (2.637 kg) M Vag-Spont EPI Y LIV     Complications: Preterm delivery  1 Term 03/20/15 [redacted]w[redacted]d  6 lb 7 oz (2.92 kg) M Vag-Spont EPI N LIV    Past Medical History:  Diagnosis Date  . Anemia   . Sickle cell trait [facility])    Past Surgical History:  Procedure Laterality Date  . NO PAST SURGERIES     Family History  Problem Relation Age of Onset  . Miscarriages / Stillbirths Paternal Aunt    Social History   Tobacco Use  . Smoking status: Former Smoker    Last attempt to quit: 04/12/2016    Years since quitting: 2.0  . Smokeless tobacco: Never Used  Substance Use Topics  . Alcohol use: No  . Drug use: No   No Known Allergies Current Outpatient Medications on File Prior to Visit  Medication Sig Dispense Refill  . Prenatal Vit-Fe Fumarate-FA (PRENATAL MULTIVITAMIN) TABS tablet Take 1 tablet by mouth daily at 12 noon.     No current facility-administered medications on file prior to visit.      Exam   Vitals:   04/17/18 1533  BP: 103/63  Pulse: 80  Weight: 92 lb 3.2 oz (41.8 kg)   Fetal Heart Rate (bpm): 145  Uterus:     Pelvic Exam: Perineum: no hemorrhoids, normal perineum   Vulva: normal external genitalia, no lesions   Vagina:  normal mucosa, normal discharge   Cervix: no lesions and normal, pap smear done.     Adnexa: normal adnexa and no mass, fullness, tenderness   Bony Pelvis: average  System: General: well-developed, well-nourished female in no acute distress   Breast:  normal appearance, no masses or tenderness   Skin: normal coloration and turgor, no rashes   Neurologic: oriented, normal, negative, normal mood   Extremities: normal strength, tone, and muscle mass, ROM of all joints is normal   HEENT extraocular movement intact and sclera clear, anicteric   Mouth/Teeth mucous membranes moist, pharynx normal without lesions and dental hygiene good   Neck supple and no masses   Cardiovascular: regular rate and rhythm   Respiratory:  no respiratory distress, normal breath sounds   Abdomen: soft, non-tender; bowel sounds normal; no masses,  no organomegaly     Assessment:   Pregnancy: [id number] Patient Active Problem List   Diagnosis Date Noted  . Supervision of high-risk pregnancy 04/17/2018  . Sickle cell trait (HCC) 04/17/2018  . History of preterm delivery 11/07/2017  . 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 Supervision of high risk pregnancy, antepartum New OB labs -  CHL AMB BABYSCRIPTS OPT IN - Culture, OB Urine - Cystic fibrosis gene test - Cytology - PAP - GC/Chlamydia probe amp ([place])not at [facility] - Genetic Screening - Obstetric Panel, Including HIV - SMN1 COPY NUMBER ANALYSIS (SMA Carrier Screen) - Hemoglobin A1c - [place] MFM OB DETAIL +14 WK; Future - [place] MFM OB COMP + 14 WK; Future  2. History of preterm delivery Declines Makena   3. Sickle cell trait (HCC) FOB is negative   Initial labs drawn. Continue prenatal vitamins. Genetic Screening discussed, NIPS: ordered. Ultrasound discussed; fetal anatomic survey: ordered. Problem list reviewed and updated.  Routine obstetric precautions reviewed. Return in 4 weeks (on 05/15/2018).

## 2018-04-17 NOTE — Progress Notes (Signed)
New OB packet given to patient.  Pt declined flu vaccination.  OB ultrasound scheduled for 12/13 at 10:45. Pt notified.

## 2018-04-18 ENCOUNTER — Encounter: Payer: Self-pay | Admitting: *Deleted

## 2018-04-18 LAB — GC/CHLAMYDIA PROBE AMP (~~LOC~~) NOT AT ARMC
Chlamydia: NEGATIVE
Neisseria Gonorrhea: NEGATIVE

## 2018-04-19 LAB — CULTURE, OB URINE

## 2018-04-19 LAB — CYTOLOGY - PAP
ADEQUACY: ABSENT
Diagnosis: NEGATIVE

## 2018-04-19 LAB — URINE CULTURE, OB REFLEX: ORGANISM ID, BACTERIA: NO GROWTH

## 2018-04-25 LAB — SMN1 COPY NUMBER ANALYSIS (SMA CARRIER SCREENING)

## 2018-04-25 LAB — OBSTETRIC PANEL, INCLUDING HIV
Antibody Screen: NEGATIVE
BASOS: 0 %
Basophils Absolute: 0 10*3/uL (ref 0.0–0.2)
EOS (ABSOLUTE): 0 10*3/uL (ref 0.0–0.4)
EOS: 0 %
HEMATOCRIT: 28.6 % — AB (ref 34.0–46.6)
HEMOGLOBIN: 9.4 g/dL — AB (ref 11.1–15.9)
HEP B S AG: NEGATIVE
HIV Screen 4th Generation wRfx: NONREACTIVE
Immature Grans (Abs): 0.1 10*3/uL (ref 0.0–0.1)
Immature Granulocytes: 1 %
LYMPHS: 18 %
Lymphocytes Absolute: 2.3 10*3/uL (ref 0.7–3.1)
MCH: 23.3 pg — ABNORMAL LOW (ref 26.6–33.0)
MCHC: 32.9 g/dL (ref 31.5–35.7)
MCV: 71 fL — ABNORMAL LOW (ref 79–97)
MONOS ABS: 0.4 10*3/uL (ref 0.1–0.9)
Monocytes: 3 %
NEUTROS PCT: 78 %
Neutrophils Absolute: 10.2 10*3/uL — ABNORMAL HIGH (ref 1.4–7.0)
Platelets: 317 10*3/uL (ref 150–450)
RBC: 4.03 x10E6/uL (ref 3.77–5.28)
RDW: 14.6 % (ref 12.3–15.4)
RH TYPE: POSITIVE
RPR: NONREACTIVE
RUBELLA: 17.3 {index} (ref >0.99)
WBC: 13.1 10*3/uL — ABNORMAL HIGH (ref 3.4–10.8)

## 2018-04-25 LAB — CYSTIC FIBROSIS GENE TEST

## 2018-04-25 LAB — HEMOGLOBIN A1C
Est. average glucose Bld gHb Est-mCnc: 103 mg/dL
Hgb A1c MFr Bld: 5.2 % (ref 4.8–5.6)

## 2018-04-29 ENCOUNTER — Encounter: Payer: Self-pay | Admitting: *Deleted

## 2018-05-15 ENCOUNTER — Encounter: Payer: Self-pay | Admitting: Family Medicine

## 2018-05-17 ENCOUNTER — Ambulatory Visit (INDEPENDENT_AMBULATORY_CARE_PROVIDER_SITE_OTHER): Payer: Medicaid Other | Admitting: Obstetrics and Gynecology

## 2018-05-17 ENCOUNTER — Encounter: Payer: Self-pay | Admitting: Obstetrics and Gynecology

## 2018-05-17 VITALS — BP 109/65 | HR 101 | Wt 100.0 lb

## 2018-05-17 DIAGNOSIS — Z3A17 17 weeks gestation of pregnancy: Secondary | ICD-10-CM

## 2018-05-17 DIAGNOSIS — D573 Sickle-cell trait: Secondary | ICD-10-CM

## 2018-05-17 DIAGNOSIS — O09212 Supervision of pregnancy with history of pre-term labor, second trimester: Secondary | ICD-10-CM | POA: Diagnosis not present

## 2018-05-17 DIAGNOSIS — Z8751 Personal history of pre-term labor: Secondary | ICD-10-CM

## 2018-05-17 DIAGNOSIS — O0992 Supervision of high risk pregnancy, unspecified, second trimester: Secondary | ICD-10-CM

## 2018-05-17 NOTE — Progress Notes (Signed)
   PRENATAL VISIT NOTE  Subjective:  Desiree Graham is a 22 y.o. [phone] at 6941w6d being seen today for ongoing prenatal care.  She is currently monitored for the following issues for this high-risk pregnancy and has HSV-2 seropositive; History of preterm delivery; Supervision of high-risk pregnancy; and Sickle cell trait (HCC) on their problem list.  Patient reports no complaints.  Contractions: Not present. Vag. Bleeding: None.   . Denies leaking of fluid.   The following portions of the patient's history were reviewed and updated as appropriate: allergies, current medications, past family history, past medical history, past social history, past surgical history and problem list. Problem list updated.  Objective:   Vitals:   05/17/18 1125  BP: 109/65  Pulse: (!) 101  Weight: 100 lb (45.4 kg)    Fetal Status: Fetal Heart Rate (bpm): 140         General:  Alert, oriented and cooperative. Patient is in no acute distress.  Skin: Skin is warm and dry. No rash noted.   Cardiovascular: Normal heart rate noted  Respiratory: Normal respiratory effort, no problems with respiration noted  Abdomen: Soft, gravid, appropriate for gestational age.  Pain/Pressure: Absent     Pelvic: Cervical exam deferred        Extremities: Normal range of motion.  Edema: None  Mental Status: Normal mood and affect. Normal behavior. Normal judgment and thought content.   Assessment and Plan:  Pregnancy: [id number] at 4841w6d  1. Supervision of high risk pregnancy in second trimester Patient is doing well Anatomy ultrasound scheduled 12/13 Reviewed Panorama results with patient- patient opted to find out gender during ultrasound AFP today  2. History of preterm delivery Patient declined weekly 17-P  3. Sickle cell trait (HCC) Patient report FOB is not a carrier  Preterm labor symptoms and general obstetric precautions including but not limited to vaginal bleeding, contractions, leaking of fluid and fetal  movement were reviewed in detail with the patient. Please refer to After Visit Summary for other counseling recommendations.  No follow-ups on file.  Future Appointments  Date Time Provider Department Center  05/24/2018 10:45 AM [facility] US 2 [facility]    Catalina AntiguaPeggy Odessie Polzin, MD

## 2018-05-17 NOTE — Progress Notes (Signed)
Home Medicaid Form completed  

## 2018-05-19 LAB — AFP, SERUM, OPEN SPINA BIFIDA
AFP MoM: 0.85
AFP Value: 51 ng/mL
GEST. AGE ON COLLECTION DATE: 17.6 wk
Maternal Age At EDD: 24.2 yr
OSBR RISK 1 IN: 10000
Test Results:: NEGATIVE
WEIGHT: 100 [lb_av]

## 2018-05-24 ENCOUNTER — Other Ambulatory Visit (HOSPITAL_COMMUNITY): Payer: Self-pay

## 2018-05-27 ENCOUNTER — Ambulatory Visit (HOSPITAL_COMMUNITY)
Admission: RE | Admit: 2018-05-27 | Discharge: 2018-05-27 | Disposition: A | Discharge status: Home / Self Care | Payer: Medicaid Other | Source: Ambulatory Visit | Attending: Family Medicine | Admitting: Family Medicine

## 2018-05-27 DIAGNOSIS — Z3A19 19 weeks gestation of pregnancy: Secondary | ICD-10-CM | POA: Diagnosis not present

## 2018-05-27 DIAGNOSIS — O0990 Supervision of high risk pregnancy, unspecified, unspecified trimester: Secondary | ICD-10-CM

## 2018-05-27 DIAGNOSIS — O09212 Supervision of pregnancy with history of pre-term labor, second trimester: Secondary | ICD-10-CM

## 2018-05-27 DIAGNOSIS — Z363 Encounter for antenatal screening for malformations: Secondary | ICD-10-CM

## 2018-05-27 IMAGING — US US MFM OB COMP +14 WKS
1 series · 14 of 28 positions shown · non-contrast
Comparison: none

[Series 1: us mfm ob comp +14 wks · 52 acquisitions, 14 frames shown]
[im 2/52]
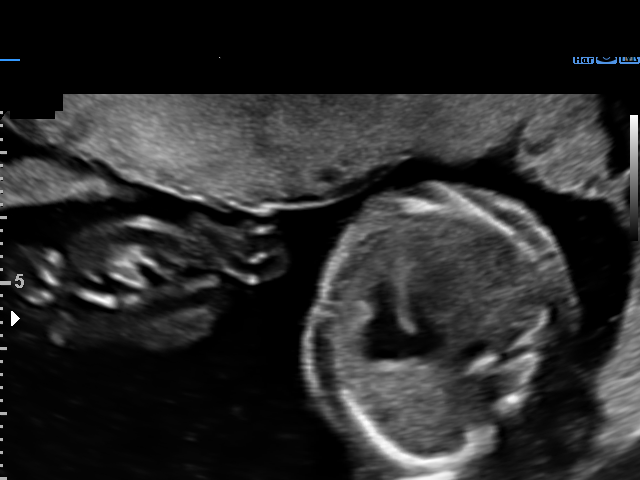
[im 6/52]
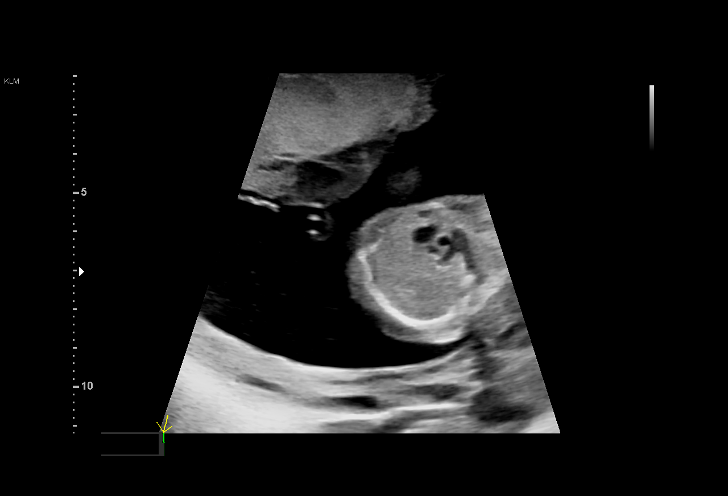
[im 10/52]
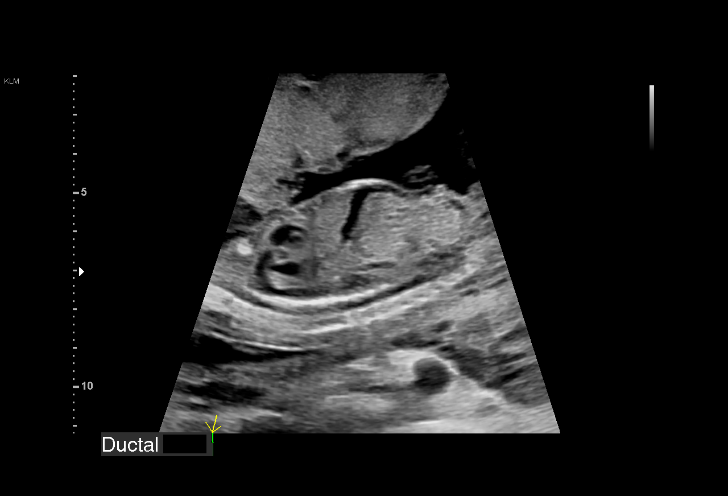
[im 14/52]
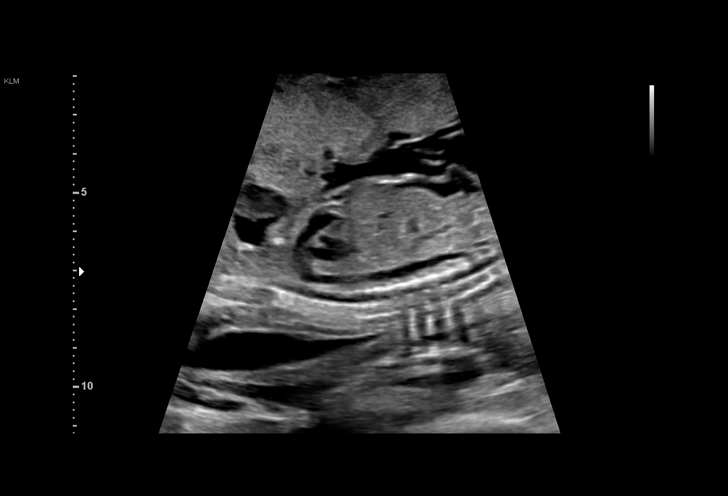
[im 18/52]
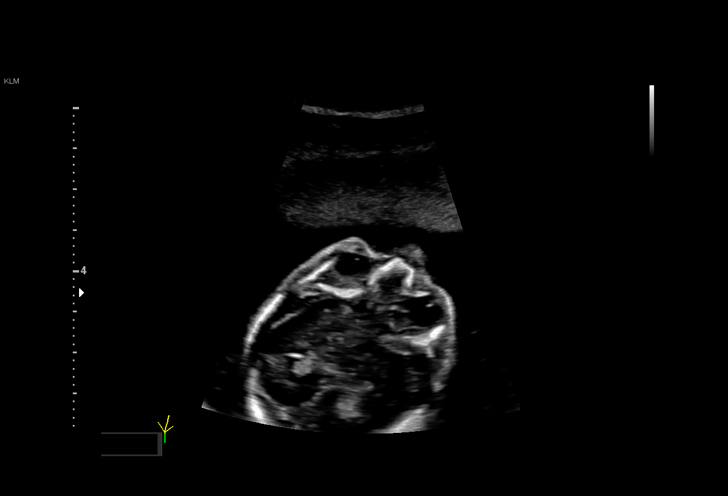
[im 21/52]
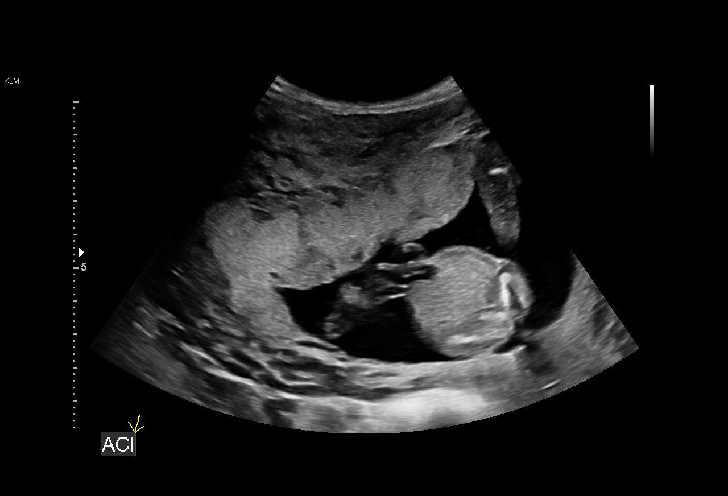
[im 25/52]
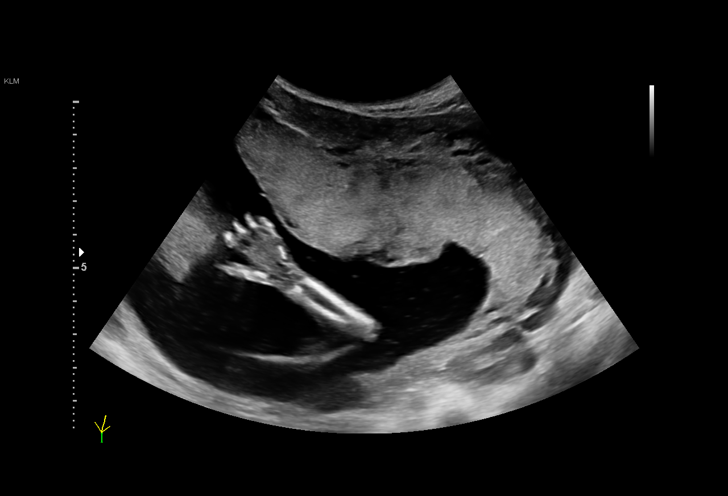
[im 29/52]
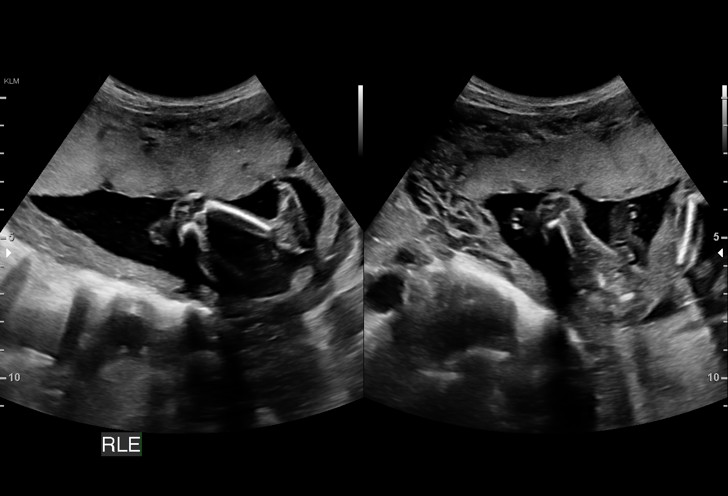
[im 33/52]
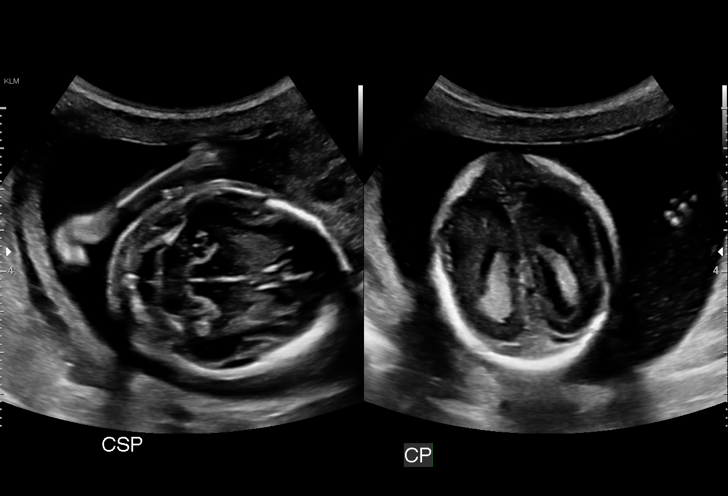
[im 36/52]
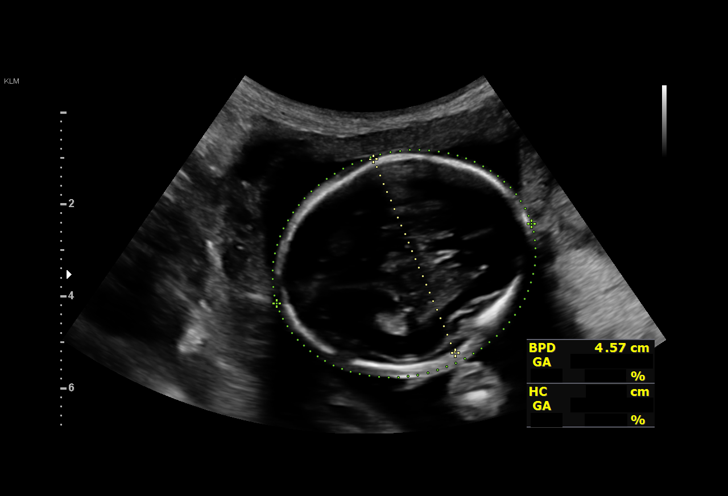
[im 40/52]
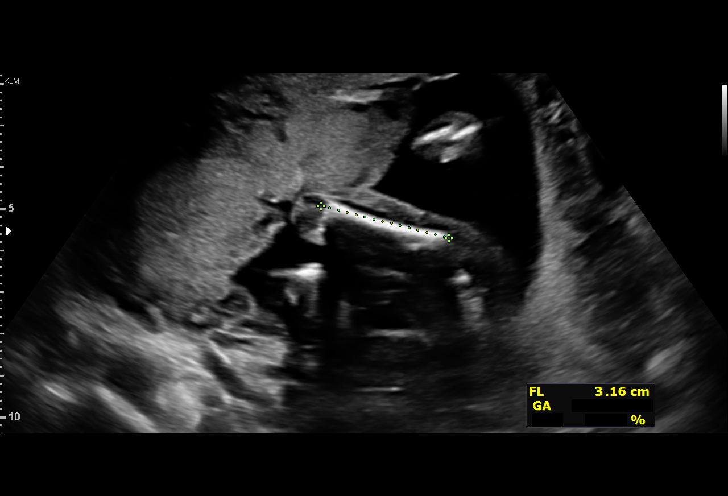
[im 44/52]
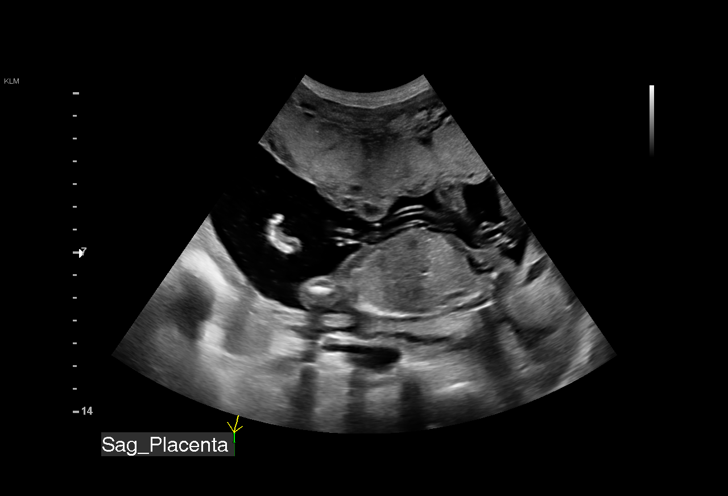
[im 48/52]
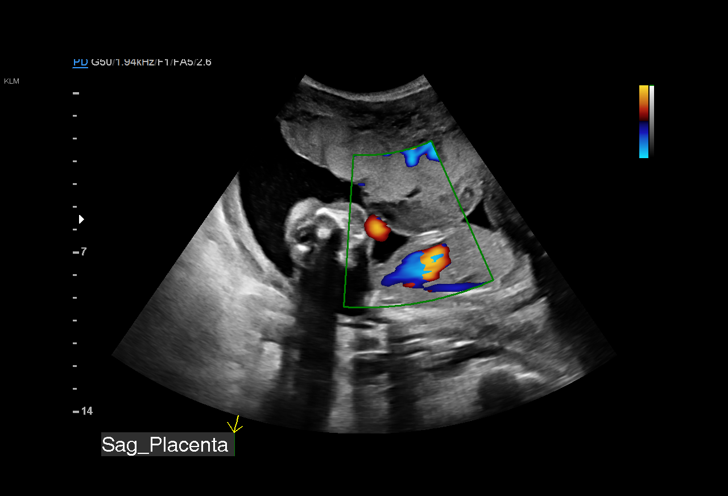
[im 52/52]
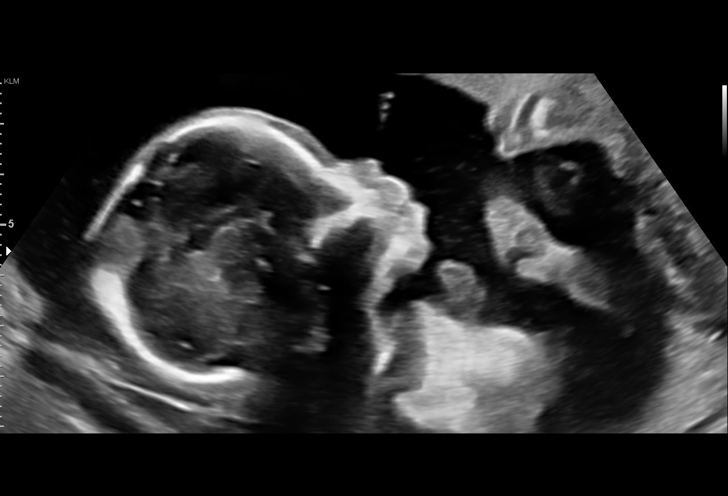

[14 of 28 positions shown; findings below may reference images not displayed]

1  US MFM OB COMP + 14 WK               76805.01     [place]
 ----------------------------------------------------------------------

 ----------------------------------------------------------------------
Indications

  Encounter for antenatal screening for          [9O]
  malformations
  19 weeks gestation of pregnancy
  Poor obstetric history: Previous preterm       [9O]
  delivery, antepartum
 ----------------------------------------------------------------------
Fetal Evaluation

 Num Of Fetuses:          1
 Fetal Heart Rate(bpm):   176
 Cardiac Activity:        Observed
 Presentation:            Variable
 Placenta:                Anterior
 P. Cord Insertion:       Visualized

 Amniotic Fluid
 AFI FV:      Within normal limits
Biometry

 BPD:      45.7  mm     G. Age:  19w 6d         73  %    CI:        74.62   %    70 - 86
                                                         FL/HC:       18.3  %    16.1 -
 HC:      167.9  mm     G. Age:  19w 3d         51  %    HC/AC:       1.21       1.09 -
 AC:      138.5  mm     G. Age:  19w 2d         45  %    FL/BPD:      67.2  %
 FL:       30.7  mm     G. Age:  19w 4d         51  %    FL/AC:       22.2  %    20 - 24
 HUM:      32.3  mm     G. Age:  20w 6d         92  %

 Est. FW:     290   gm   0 lb 10 oz      48  %
OB History
 Gravidity:    4         Term:   2        Prem:   1        SAB:   0
 TOP:          0       Ectopic:  0        Living: 3
Gestational Age

 LMP:           19w 2d        Date:  [DATE]                 EDD:   [DATE]
 U/S Today:     19w 4d                                        EDD:   [DATE]
 Best:          19w 2d     Det. By:  LMP  ([DATE])          EDD:   [DATE]
Anatomy

 Cranium:               Appears normal         Aortic Arch:            Appears normal
 Cavum:                 Appears normal         Ductal Arch:            Appears normal
 Ventricles:            Appears normal         Diaphragm:              Appears normal
 Choroid Plexus:        Appears normal         Stomach:                Appears normal, left
                                                                       sided
 Cerebellum:            Appears normal         Abdomen:                Appears normal
 Posterior Fossa:       Appears normal         Abdominal Wall:         Appears nml (cord
                                                                       insert, abd wall)
 Nuchal Fold:           Appears normal         Cord Vessels:           Appears normal (3
                                                                       vessel cord)
 Face:                  Appears normal         Kidneys:                Appear normal
                        (orbits and profile)
 Lips:                  Appears normal         Bladder:                Appears normal
 Thoracic:              Appears normal         Spine:                  Appears normal
 Heart:                 Appears normal         Upper Extremities:      Appears normal
                        (4CH, axis, and situs
 RVOT:                  Appears normal         Lower Extremities:      Appears normal
 LVOT:                  Appears normal

 Other:  Fetus appears to be female. Heels and 5th digit visualized.
Cervix Uterus Adnexa

 Cervix
 Length:            4.3  cm.
 Normal appearance by transabdominal scan.

 Adnexa
 No abnormality visualized.
Impression

 Normal interval growth.  No ultrasonic evidence of structural
 fetal anomalies.
Recommendations

 Follow up as clinically indicated.
 Consideration may be given to serial cervical length exams
 given history of 35 week delivery however, a subsequent
 delivery at 38 weeks occured as well.

## 2018-06-12 NOTE — L&D Delivery Note (Signed)
   Delivery Note Pt progressed without augmentation to C/C/+3 w/urge to push.  Epidural was working minimally well AROM w/clear fluid. After a 10 minute 2nd stage, at 4:17 AM a viable female was delivered via Vaginal, Spontaneous (Presentation: LOA ).  APGAR: 9, 9; weight pending. After 1 minute, the cord was clamped and cut.The placenta separated spontaneously and delivered via CCT and maternal pushing effort.  It was inspected and appears to be intact with a 3 VC. 40 units of pitocin diluted in 1000cc LR was infused rapidly IV.      Anesthesia:  epidural Episiotomy: None Lacerations: None Suture Repair:  Est. Blood Loss (mL): 50  Mom to postpartum.  Baby to Couplet care / Skin to Skin.  Jacklyn Shell 10/11/2018, 4:37 AM

## 2018-06-14 ENCOUNTER — Encounter: Payer: Self-pay | Admitting: Obstetrics and Gynecology

## 2018-06-20 ENCOUNTER — Encounter: Payer: Self-pay | Admitting: Obstetrics & Gynecology

## 2018-06-20 ENCOUNTER — Ambulatory Visit (INDEPENDENT_AMBULATORY_CARE_PROVIDER_SITE_OTHER): Payer: Medicaid Other | Admitting: Obstetrics & Gynecology

## 2018-06-20 VITALS — BP 107/66 | HR 97 | Wt 104.0 lb

## 2018-06-20 DIAGNOSIS — Z8751 Personal history of pre-term labor: Secondary | ICD-10-CM

## 2018-06-20 DIAGNOSIS — O0992 Supervision of high risk pregnancy, unspecified, second trimester: Secondary | ICD-10-CM

## 2018-06-20 DIAGNOSIS — R768 Other specified abnormal immunological findings in serum: Secondary | ICD-10-CM

## 2018-06-20 DIAGNOSIS — Z3A22 22 weeks gestation of pregnancy: Secondary | ICD-10-CM

## 2018-06-20 NOTE — Progress Notes (Signed)
   PRENATAL VISIT NOTE  Subjective:  Desiree Graham is a 23 y.o. [phone] at [redacted]w[redacted]d being seen today for ongoing prenatal care.  She is currently monitored for the following issues for this low-risk pregnancy and has HSV-2 seropositive; History of preterm delivery; Supervision of high-risk pregnancy; and Sickle cell trait (HCC) on their problem list.  Patient reports no complaints.  Contractions: Not present. Vag. Bleeding: None.  Movement: Present. Denies leaking of fluid.   The following portions of the patient's history were reviewed and updated as appropriate: allergies, current medications, past family history, past medical history, past social history, past surgical history and problem list. Problem list updated.  Objective:   Vitals:   06/20/18 1045  BP: 107/66  Pulse: 97  Weight: 104 lb (47.2 kg)    Fetal Status: Fetal Heart Rate (bpm): 156   Movement: Present     General:  Alert, oriented and cooperative. Patient is in no acute distress.  Skin: Skin is warm and dry. No rash noted.   Cardiovascular: Normal heart rate noted  Respiratory: Normal respiratory effort, no problems with respiration noted  Abdomen: Soft, gravid, appropriate for gestational age.  Pain/Pressure: Absent     Pelvic: Cervical exam deferred        Extremities: Normal range of motion.  Edema: None  Mental Status: Normal mood and affect. Normal behavior. Normal judgment and thought content.   Assessment and Plan:  Pregnancy: [id number] at [redacted]w[redacted]d  1. Supervision of high risk pregnancy in second trimester   2. HSV-2 seropositive - treat with valtrex at 35 weeks  3. History of preterm delivery - she declined 17-P  Preterm labor symptoms and general obstetric precautions including but not limited to vaginal bleeding, contractions, leaking of fluid and fetal movement were reviewed in detail with the patient. Please refer to After Visit Summary for other counseling recommendations.  No follow-ups on  file.  No future appointments.  Allie Bossier, MD

## 2018-07-18 ENCOUNTER — Ambulatory Visit (INDEPENDENT_AMBULATORY_CARE_PROVIDER_SITE_OTHER): Payer: Medicaid Other | Admitting: Obstetrics & Gynecology

## 2018-07-18 VITALS — BP 126/65 | HR 91 | Wt 108.1 lb

## 2018-07-18 DIAGNOSIS — D573 Sickle-cell trait: Secondary | ICD-10-CM

## 2018-07-18 DIAGNOSIS — Z8751 Personal history of pre-term labor: Secondary | ICD-10-CM

## 2018-07-18 DIAGNOSIS — O0992 Supervision of high risk pregnancy, unspecified, second trimester: Secondary | ICD-10-CM

## 2018-07-18 DIAGNOSIS — R768 Other specified abnormal immunological findings in serum: Secondary | ICD-10-CM

## 2018-07-18 NOTE — Progress Notes (Signed)
   PRENATAL VISIT NOTE  Subjective:  Desiree Graham is a 23 y.o. [phone] at [redacted]w[redacted]d being seen today for ongoing prenatal care.  She is currently monitored for the following issues for this high-risk pregnancy and has HSV-2 seropositive; History of preterm delivery; Supervision of high-risk pregnancy; and Sickle cell trait (HCC) on their problem list.  Patient reports no complaints.  Contractions: Not present. Vag. Bleeding: None.  Movement: Present. Denies leaking of fluid.   The following portions of the patient's history were reviewed and updated as appropriate: allergies, current medications, past family history, past medical history, past social history, past surgical history and problem list. Problem list updated.  Objective:   Vitals:   07/18/18 1100  BP: 126/65  Pulse: 91  Weight: 108 lb 1.6 oz (49 kg)    Fetal Status: Fetal Heart Rate (bpm): 145   Movement: Present     General:  Alert, oriented and cooperative. Patient is in no acute distress.  Skin: Skin is warm and dry. No rash noted.   Cardiovascular: Normal heart rate noted  Respiratory: Normal respiratory effort, no problems with respiration noted  Abdomen: Soft, gravid, appropriate for gestational age.  Pain/Pressure: Absent     Pelvic: Cervical exam deferred        Extremities: Normal range of motion.  Edema: None  Mental Status: Normal mood and affect. Normal behavior. Normal judgment and thought content.   Assessment and Plan:  Pregnancy: [id number] at [redacted]w[redacted]d  1. HSV-2 seropositive - treat at 35 weeks/prn sooner  2. Supervision of high risk pregnancy in second trimester   3. H/o PTD - she declined 17-P  4. Sickle cell trait (HCC)   Preterm labor symptoms and general obstetric precautions including but not limited to vaginal bleeding, contractions, leaking of fluid and fetal movement were reviewed in detail with the patient. Please refer to After Visit Summary for other counseling recommendations.  No  follow-ups on file.  No future appointments.  Allie Bossier, MD

## 2018-08-01 ENCOUNTER — Other Ambulatory Visit: Payer: Self-pay

## 2018-08-07 ENCOUNTER — Other Ambulatory Visit: Payer: Self-pay | Admitting: *Deleted

## 2018-08-07 DIAGNOSIS — O0990 Supervision of high risk pregnancy, unspecified, unspecified trimester: Secondary | ICD-10-CM

## 2018-08-08 ENCOUNTER — Other Ambulatory Visit: Payer: Self-pay

## 2018-08-08 ENCOUNTER — Encounter: Payer: Self-pay | Admitting: Obstetrics & Gynecology

## 2018-08-09 ENCOUNTER — Other Ambulatory Visit: Payer: Self-pay

## 2018-08-09 ENCOUNTER — Encounter: Payer: Self-pay | Admitting: Obstetrics & Gynecology

## 2018-08-09 ENCOUNTER — Other Ambulatory Visit: Payer: Self-pay | Admitting: *Deleted

## 2018-08-09 DIAGNOSIS — O0990 Supervision of high risk pregnancy, unspecified, unspecified trimester: Secondary | ICD-10-CM

## 2018-08-21 ENCOUNTER — Other Ambulatory Visit: Payer: Medicaid Other

## 2018-08-21 ENCOUNTER — Ambulatory Visit (INDEPENDENT_AMBULATORY_CARE_PROVIDER_SITE_OTHER): Payer: Medicaid Other | Admitting: Obstetrics and Gynecology

## 2018-08-21 ENCOUNTER — Other Ambulatory Visit: Payer: Self-pay

## 2018-08-21 VITALS — BP 106/67 | HR 101 | Wt 110.0 lb

## 2018-08-21 DIAGNOSIS — Z3A31 31 weeks gestation of pregnancy: Secondary | ICD-10-CM

## 2018-08-21 DIAGNOSIS — O0993 Supervision of high risk pregnancy, unspecified, third trimester: Secondary | ICD-10-CM

## 2018-08-21 DIAGNOSIS — O0990 Supervision of high risk pregnancy, unspecified, unspecified trimester: Secondary | ICD-10-CM

## 2018-08-21 DIAGNOSIS — Z8751 Personal history of pre-term labor: Secondary | ICD-10-CM

## 2018-08-21 DIAGNOSIS — O26843 Uterine size-date discrepancy, third trimester: Secondary | ICD-10-CM | POA: Insufficient documentation

## 2018-08-21 DIAGNOSIS — R768 Other specified abnormal immunological findings in serum: Secondary | ICD-10-CM

## 2018-08-21 DIAGNOSIS — O288 Other abnormal findings on antenatal screening of mother: Secondary | ICD-10-CM

## 2018-08-21 MED ORDER — VALACYCLOVIR HCL 500 MG PO TABS: 500.0000 mg | ORAL_TABLET | Freq: Two times a day (BID) | ORAL | 3 refills | Status: AC

## 2018-08-21 NOTE — Progress Notes (Signed)
Prenatal Visit Note Date: 08/21/2018 Clinic: [facility]  Subjective:  Desiree Graham is a 23 y.o. [phone] at [redacted]w[redacted]d being seen today for ongoing prenatal care.  She is currently monitored for the following issues for this high-risk pregnancy and has HSV-2 seropositive; History of preterm delivery; Supervision of high-risk pregnancy; Sickle cell trait (HCC); and Fundal height low for dates in third trimester on their problem list.  Patient reports no complaints.   Contractions: Not present. Vag. Bleeding: None.  Movement: Present. Denies leaking of fluid.   The following portions of the patient's history were reviewed and updated as appropriate: allergies, current medications, past family history, past medical history, past social history, past surgical history and problem list. Problem list updated.  Objective:   Vitals:   08/21/18 0930  BP: 106/67  Pulse: (!) 101  Weight: 110 lb (49.9 kg)    Fetal Status: Fetal Heart Rate (bpm): 152 Fundal Height: 28 cm Movement: Present     General:  Alert, oriented and cooperative. Patient is in no acute distress.  Skin: Skin is warm and dry. No rash noted.   Cardiovascular: Normal heart rate noted  Respiratory: Normal respiratory effort, no problems with respiration noted  Abdomen: Soft, gravid, appropriate for gestational age. Pain/Pressure: Absent     Pelvic:  Cervical exam deferred        Extremities: Normal range of motion.  Edema: None  Mental Status: Normal mood and affect. Normal behavior. Normal judgment and thought content.   Urinalysis:      Assessment and Plan:  Pregnancy: [id number] at [redacted]w[redacted]d  1. Supervision of high risk pregnancy in third trimester R/b/a d/w and btl paperwork signed. 28wk labs today - [place] [facility] OB FOLLOW UP; Future  2. History of preterm delivery Declined 17p   3. HSV-2 seropositive Valtrex ppx sent in  4. Fundal height low for dates in third trimester Low normal @ 28. Will get growth  u/s. Pt states fob is average sized - [place] [facility] OB FOLLOW UP; Future  Preterm labor symptoms and general obstetric precautions including but not limited to vaginal bleeding, contractions, leaking of fluid and fetal movement were reviewed in detail with the patient. Please refer to After Visit Summary for other counseling recommendations.  Return in about 2 weeks (around 09/04/2018) for hrob.   Bracey Bing, MD

## 2018-08-22 LAB — CBC
HEMATOCRIT: 27.8 % — AB (ref 34.0–46.6)
Hemoglobin: 8.6 g/dL — ABNORMAL LOW (ref 11.1–15.9)
MCH: 19.5 pg — AB (ref 26.6–33.0)
MCHC: 30.9 g/dL — ABNORMAL LOW (ref 31.5–35.7)
MCV: 63 fL — AB (ref 79–97)
Platelets: 214 10*3/uL (ref 150–450)
RBC: 4.42 x10E6/uL (ref 3.77–5.28)
RDW: 17.4 % — ABNORMAL HIGH (ref 11.7–15.4)
WBC: 11.6 10*3/uL — ABNORMAL HIGH (ref 3.4–10.8)

## 2018-08-22 LAB — RPR: RPR Ser Ql: NONREACTIVE

## 2018-08-22 LAB — GLUCOSE TOLERANCE, 2 HOURS W/ 1HR
GLUCOSE, FASTING: 74 mg/dL (ref 65–91)
Glucose, 1 hour: 114 mg/dL (ref 65–179)
Glucose, 2 hour: 63 mg/dL — ABNORMAL LOW (ref 65–152)

## 2018-08-22 LAB — HIV ANTIBODY (ROUTINE TESTING W REFLEX): HIV SCREEN 4TH GENERATION: NONREACTIVE

## 2018-08-28 ENCOUNTER — Ambulatory Visit (HOSPITAL_COMMUNITY)
Admission: RE | Admit: 2018-08-28 | Discharge: 2018-08-28 | Disposition: A | Discharge status: Home / Self Care | Payer: Medicaid Other | Source: Ambulatory Visit | Attending: Family Medicine | Admitting: Family Medicine

## 2018-08-28 ENCOUNTER — Ambulatory Visit (HOSPITAL_COMMUNITY): Payer: Medicaid Other | Admitting: *Deleted

## 2018-08-28 ENCOUNTER — Other Ambulatory Visit: Payer: Self-pay

## 2018-08-28 ENCOUNTER — Encounter (HOSPITAL_COMMUNITY): Payer: Self-pay

## 2018-08-28 VITALS — BP 108/52 | HR 84 | Wt 112.4 lb

## 2018-08-28 DIAGNOSIS — O0990 Supervision of high risk pregnancy, unspecified, unspecified trimester: Secondary | ICD-10-CM | POA: Diagnosis present

## 2018-08-28 DIAGNOSIS — O26843 Uterine size-date discrepancy, third trimester: Secondary | ICD-10-CM | POA: Diagnosis present

## 2018-08-28 DIAGNOSIS — Z3A32 32 weeks gestation of pregnancy: Secondary | ICD-10-CM | POA: Diagnosis not present

## 2018-08-28 DIAGNOSIS — O0993 Supervision of high risk pregnancy, unspecified, third trimester: Secondary | ICD-10-CM | POA: Diagnosis present

## 2018-08-28 DIAGNOSIS — O09213 Supervision of pregnancy with history of pre-term labor, third trimester: Secondary | ICD-10-CM

## 2018-08-28 DIAGNOSIS — Z362 Encounter for other antenatal screening follow-up: Secondary | ICD-10-CM

## 2018-09-03 ENCOUNTER — Encounter: Payer: Self-pay | Admitting: *Deleted

## 2018-09-09 ENCOUNTER — Other Ambulatory Visit: Payer: Self-pay

## 2018-09-09 ENCOUNTER — Encounter: Payer: Self-pay | Admitting: Obstetrics and Gynecology

## 2018-09-09 ENCOUNTER — Telehealth: Payer: Self-pay | Admitting: Obstetrics and Gynecology

## 2018-09-09 ENCOUNTER — Ambulatory Visit (INDEPENDENT_AMBULATORY_CARE_PROVIDER_SITE_OTHER): Payer: Medicaid Other | Admitting: Obstetrics and Gynecology

## 2018-09-09 VITALS — BP 110/63 | HR 78 | Temp 98.4°F | Wt 112.4 lb

## 2018-09-09 DIAGNOSIS — O0993 Supervision of high risk pregnancy, unspecified, third trimester: Secondary | ICD-10-CM

## 2018-09-09 DIAGNOSIS — O99013 Anemia complicating pregnancy, third trimester: Secondary | ICD-10-CM

## 2018-09-09 DIAGNOSIS — Z8751 Personal history of pre-term labor: Secondary | ICD-10-CM

## 2018-09-09 DIAGNOSIS — O99019 Anemia complicating pregnancy, unspecified trimester: Secondary | ICD-10-CM | POA: Insufficient documentation

## 2018-09-09 DIAGNOSIS — Z3A34 34 weeks gestation of pregnancy: Secondary | ICD-10-CM

## 2018-09-09 DIAGNOSIS — Z3009 Encounter for other general counseling and advice on contraception: Secondary | ICD-10-CM

## 2018-09-09 DIAGNOSIS — R768 Other specified abnormal immunological findings in serum: Secondary | ICD-10-CM

## 2018-09-09 NOTE — Patient Instructions (Signed)
Third Trimester of Pregnancy The third trimester is from week 28 through week 40 (months 7 through 9). The third trimester is a time when the unborn baby (fetus) is growing rapidly. At the end of the ninth month, the fetus is about 20 inches in length and weighs 6-10 pounds. Body changes during your third trimester Your body will continue to go through many changes during pregnancy. The changes vary from woman to woman. During the third trimester:  Your weight will continue to increase. You can expect to gain 25-35 pounds (11-16 kg) by the end of the pregnancy.  You may begin to get stretch marks on your hips, abdomen, and breasts.  You may urinate more often because the fetus is moving lower into your pelvis and pressing on your bladder.  You may develop or continue to have heartburn. This is caused by increased hormones that slow down muscles in the digestive tract.  You may develop or continue to have constipation because increased hormones slow digestion and cause the muscles that push waste through your intestines to relax.  You may develop hemorrhoids. These are swollen veins (varicose veins) in the rectum that can itch or be painful.  You may develop swollen, bulging veins (varicose veins) in your legs.  You may have increased body aches in the pelvis, back, or thighs. This is due to weight gain and increased hormones that are relaxing your joints.  You may have changes in your hair. These can include thickening of your hair, rapid growth, and changes in texture. Some women also have hair loss during or after pregnancy, or hair that feels dry or thin. Your hair will most likely return to normal after your baby is born.  Your breasts will continue to grow and they will continue to become tender. A yellow fluid (colostrum) may leak from your breasts. This is the first milk you are producing for your baby.  Your belly button may stick out.  You may notice more swelling in your hands,  face, or ankles.  You may have increased tingling or numbness in your hands, arms, and legs. The skin on your belly may also feel numb.  You may feel short of breath because of your expanding uterus.  You may have more problems sleeping. This can be caused by the size of your belly, increased need to urinate, and an increase in your body's metabolism.  You may notice the fetus "dropping," or moving lower in your abdomen (lightening).  You may have increased vaginal discharge.  You may notice your joints feel loose and you may have pain around your pelvic bone. What to expect at prenatal visits You will have prenatal exams every 2 weeks until week 36. Then you will have weekly prenatal exams. During a routine prenatal visit:  You will be weighed to make sure you and the baby are growing normally.  Your blood pressure will be taken.  Your abdomen will be measured to track your baby's growth.  The fetal heartbeat will be listened to.  Any test results from the previous visit will be discussed.  You may have a cervical check near your due date to see if your cervix has softened or thinned (effaced).  You will be tested for Group B streptococcus. This happens between 35 and 37 weeks. Your health care provider may ask you:  What your birth plan is.  How you are feeling.  If you are feeling the baby move.  If you have had any abnormal   symptoms, such as leaking fluid, bleeding, severe headaches, or abdominal cramping.  If you are using any tobacco products, including cigarettes, chewing tobacco, and electronic cigarettes.  If you have any questions. Other tests or screenings that may be performed during your third trimester include:  Blood tests that check for low iron levels (anemia).  Fetal testing to check the health, activity level, and growth of the fetus. Testing is done if you have certain medical conditions or if there are problems during the pregnancy.  Nonstress test  (NST). This test checks the health of your baby to make sure there are no signs of problems, such as the baby not getting enough oxygen. During this test, a belt is placed around your belly. The baby is made to move, and its heart rate is monitored during movement. What is false labor? False labor is a condition in which you feel small, irregular tightenings of the muscles in the womb (contractions) that usually go away with rest, changing position, or drinking water. These are called Braxton Hicks contractions. Contractions may last for hours, days, or even weeks before true labor sets in. If contractions come at regular intervals, become more frequent, increase in intensity, or become painful, you should see your health care provider. What are the signs of labor?  Abdominal cramps.  Regular contractions that start at 10 minutes apart and become stronger and more frequent with time.  Contractions that start on the top of the uterus and spread down to the lower abdomen and back.  Increased pelvic pressure and dull back pain.  A watery or bloody mucus discharge that comes from the vagina.  Leaking of amniotic fluid. This is also known as your "water breaking." It could be a slow trickle or a gush. Let your health care provider know if it has a color or strange odor. If you have any of these signs, call your health care provider right away, even if it is before your due date. Follow these instructions at home: Medicines  Follow your health care provider's instructions regarding medicine use. Specific medicines may be either safe or unsafe to take during pregnancy.  Take a prenatal vitamin that contains at least 600 micrograms (mcg) of folic acid.  If you develop constipation, try taking a stool softener if your health care provider approves. Eating and drinking   Eat a balanced diet that includes fresh fruits and vegetables, whole grains, good sources of protein such as meat, eggs, or tofu,  and low-fat dairy. Your health care provider will help you determine the amount of weight gain that is right for you.  Avoid raw meat and uncooked cheese. These carry germs that can cause birth defects in the baby.  If you have low calcium intake from food, talk to your health care provider about whether you should take a daily calcium supplement.  Eat four or five small meals rather than three large meals a day.  Limit foods that are high in fat and processed sugars, such as fried and sweet foods.  To prevent constipation: ? Drink enough fluid to keep your urine clear or pale yellow. ? Eat foods that are high in fiber, such as fresh fruits and vegetables, whole grains, and beans. Activity  Exercise only as directed by your health care provider. Most women can continue their usual exercise routine during pregnancy. Try to exercise for 30 minutes at least 5 days a week. Stop exercising if you experience uterine contractions.  Avoid heavy lifting.  Do   not exercise in extreme heat or humidity, or at high altitudes.  Wear low-heel, comfortable shoes.  Practice good posture.  You may continue to have sex unless your health care provider tells you otherwise. Relieving pain and discomfort  Take frequent breaks and rest with your legs elevated if you have leg cramps or low back pain.  Take warm sitz baths to soothe any pain or discomfort caused by hemorrhoids. Use hemorrhoid cream if your health care provider approves.  Wear a good support bra to prevent discomfort from breast tenderness.  If you develop varicose veins: ? Wear support pantyhose or compression stockings as told by your healthcare provider. ? Elevate your feet for 15 minutes, 3-4 times a day. Prenatal care  Write down your questions. Take them to your prenatal visits.  Keep all your prenatal visits as told by your health care provider. This is important. Safety  Wear your seat belt at all times when driving.  Make  a list of emergency phone numbers, including numbers for family, friends, the hospital, and police and fire departments. General instructions  Avoid cat litter boxes and soil used by cats. These carry germs that can cause birth defects in the baby. If you have a cat, ask someone to clean the litter box for you.  Do not travel far distances unless it is absolutely necessary and only with the approval of your health care provider.  Do not use hot tubs, steam rooms, or saunas.  Do not drink alcohol.  Do not use any products that contain nicotine or tobacco, such as cigarettes and e-cigarettes. If you need help quitting, ask your health care provider.  Do not use any medicinal herbs or unprescribed drugs. These chemicals affect the formation and growth of the baby.  Do not douche or use tampons or scented sanitary pads.  Do not cross your legs for long periods of time.  To prepare for the arrival of your baby: ? Take prenatal classes to understand, practice, and ask questions about labor and delivery. ? Make a trial run to the hospital. ? Visit the hospital and tour the maternity area. ? Arrange for maternity or paternity leave through employers. ? Arrange for family and friends to take care of pets while you are in the hospital. ? Purchase a rear-facing car seat and make sure you know how to install it in your car. ? Pack your hospital bag. ? Prepare the baby's nursery. Make sure to remove all pillows and stuffed animals from the baby's crib to prevent suffocation.  Visit your dentist if you have not gone during your pregnancy. Use a soft toothbrush to brush your teeth and be gentle when you floss. Contact a health care provider if:  You are unsure if you are in labor or if your water has broken.  You become dizzy.  You have mild pelvic cramps, pelvic pressure, or nagging pain in your abdominal area.  You have lower back pain.  You have persistent nausea, vomiting, or  diarrhea.  You have an unusual or bad smelling vaginal discharge.  You have pain when you urinate. Get help right away if:  Your water breaks before 37 weeks.  You have regular contractions less than 5 minutes apart before 37 weeks.  You have a fever.  You are leaking fluid from your vagina.  You have spotting or bleeding from your vagina.  You have severe abdominal pain or cramping.  You have rapid weight loss or weight gain.  You have   shortness of breath with chest pain.  You notice sudden or extreme swelling of your face, hands, ankles, feet, or legs.  Your baby makes fewer than 10 movements in 2 hours.  You have severe headaches that do not go away when you take medicine.  You have vision changes. Summary  The third trimester is from week 28 through week 40, months 7 through 9. The third trimester is a time when the unborn baby (fetus) is growing rapidly.  During the third trimester, your discomfort may increase as you and your baby continue to gain weight. You may have abdominal, leg, and back pain, sleeping problems, and an increased need to urinate.  During the third trimester your breasts will keep growing and they will continue to become tender. A yellow fluid (colostrum) may leak from your breasts. This is the first milk you are producing for your baby.  False labor is a condition in which you feel small, irregular tightenings of the muscles in the womb (contractions) that eventually go away. These are called Braxton Hicks contractions. Contractions may last for hours, days, or even weeks before true labor sets in.  Signs of labor can include: abdominal cramps; regular contractions that start at 10 minutes apart and become stronger and more frequent with time; watery or bloody mucus discharge that comes from the vagina; increased pelvic pressure and dull back pain; and leaking of amniotic fluid. This information is not intended to replace advice given to you by your  health care provider. Make sure you discuss any questions you have with your health care provider. Document Released: 05/23/2001 Document Revised: 07/04/2016 Document Reviewed: 07/04/2016 Elsevier Interactive Patient Education  2019 [employer]  

## 2018-09-09 NOTE — Telephone Encounter (Signed)
Left a detailed voicemail informing the patient of the rescheduled appointment due to the COVID19.

## 2018-09-09 NOTE — Progress Notes (Signed)
Subjective:  Desiree Graham is a 23 y.o. [phone] at [redacted]w[redacted]d being seen today for ongoing prenatal care.  She is currently monitored for the following issues for this high-risk pregnancy and has HSV-2 seropositive; History of preterm delivery; Supervision of high-risk pregnancy; Sickle cell trait (HCC); Fundal height low for dates in third trimester; Anemia affecting pregnancy; and Unwanted fertility on their problem list.  Patient reports general discomforts of pregnancy.  Contractions: Irritability. Vag. Bleeding: None.  Movement: Present. Denies leaking of fluid.   The following portions of the patient's history were reviewed and updated as appropriate: allergies, current medications, past family history, past medical history, past social history, past surgical history and problem list. Problem list updated.  Objective:   Vitals:   09/09/18 1330  BP: 110/63  Pulse: 78  Temp: 98.4 F (36.9 C)  Weight: 112 lb 6.4 oz (51 kg)    Fetal Status: Fetal Heart Rate (bpm): 144   Movement: Present     General:  Alert, oriented and cooperative. Patient is in no acute distress.  Skin: Skin is warm and dry. No rash noted.   Cardiovascular: Normal heart rate noted  Respiratory: Normal respiratory effort, no problems with respiration noted  Abdomen: Soft, gravid, appropriate for gestational age. Pain/Pressure: Absent     Pelvic:  Cervical exam deferred        Extremities: Normal range of motion.  Edema: None  Mental Status: Normal mood and affect. Normal behavior. Normal judgment and thought content.   Urinalysis:      Assessment and Plan:  Pregnancy: [id number] at [redacted]w[redacted]d  1. Supervision of high risk pregnancy in third trimester Stable GBS/Vaginal cultures next visit U/S 08/28/18 growth 37 % 1786 gm 2. History of preterm delivery Declined 17 OHP  3. HSV-2 seropositive To start Valtrex suppression in 2 weeks Reviewed with pt Pt already has Rx  4. Anemia affecting pregnancy in third  trimester Continue with PNV and iron   5. Unwanted fertility BTL papers signed today Pt may aware of no elective PPBTL at present   Preterm labor symptoms and general obstetric precautions including but not limited to vaginal bleeding, contractions, leaking of fluid and fetal movement were reviewed in detail with the patient. Please refer to After Visit Summary for other counseling recommendations.  Return in about 2 weeks (around 09/23/2018) for OB visit.   Hermina Staggers, MD

## 2018-09-23 ENCOUNTER — Encounter: Payer: Self-pay | Admitting: Obstetrics & Gynecology

## 2018-09-25 ENCOUNTER — Ambulatory Visit (INDEPENDENT_AMBULATORY_CARE_PROVIDER_SITE_OTHER): Payer: Medicaid Other | Admitting: Obstetrics and Gynecology

## 2018-09-25 ENCOUNTER — Other Ambulatory Visit (HOSPITAL_COMMUNITY)
Admission: RE | Admit: 2018-09-25 | Discharge: 2018-09-25 | Disposition: A | Discharge status: Home / Self Care | Payer: Medicaid Other | Source: Ambulatory Visit | Attending: Obstetrics and Gynecology | Admitting: Obstetrics and Gynecology

## 2018-09-25 ENCOUNTER — Other Ambulatory Visit: Payer: Self-pay

## 2018-09-25 VITALS — BP 108/60 | HR 101 | Temp 97.8°F | Wt 116.7 lb

## 2018-09-25 DIAGNOSIS — O0993 Supervision of high risk pregnancy, unspecified, third trimester: Secondary | ICD-10-CM | POA: Insufficient documentation

## 2018-09-25 DIAGNOSIS — R768 Other specified abnormal immunological findings in serum: Secondary | ICD-10-CM

## 2018-09-25 DIAGNOSIS — O99013 Anemia complicating pregnancy, third trimester: Secondary | ICD-10-CM

## 2018-09-25 DIAGNOSIS — Z3A36 36 weeks gestation of pregnancy: Secondary | ICD-10-CM

## 2018-09-25 MED ORDER — VALACYCLOVIR HCL 500 MG PO TABS: 500.0000 mg | ORAL_TABLET | Freq: Two times a day (BID) | ORAL | 0 refills | Status: DC

## 2018-09-25 MED ORDER — FERROUS SULFATE 325 (65 FE) MG PO TABS: 325.0000 mg | ORAL_TABLET | Freq: Two times a day (BID) | ORAL | 0 refills | Status: DC

## 2018-09-25 NOTE — Progress Notes (Signed)
Prenatal Visit Note Date: 09/25/2018 Clinic: [facility]  Subjective:  Desiree Graham is a 23 y.o. [phone] at [redacted]w[redacted]d being seen today for ongoing prenatal care.  She is currently monitored for the following issues for this low-risk pregnancy and has HSV-2 seropositive; History of preterm delivery; Supervision of high-risk pregnancy; Sickle cell trait (HCC); Fundal height low for dates in third trimester; Anemia affecting pregnancy; and Unwanted fertility on their problem list.  Patient reports no complaints.   Contractions: Irritability. Vag. Bleeding: None.  Movement: Present. Denies leaking of fluid.   The following portions of the patient's history were reviewed and updated as appropriate: allergies, current medications, past family history, past medical history, past social history, past surgical history and problem list. Problem list updated.  Objective:   Vitals:   09/25/18 1046  BP: 108/60  Pulse: (!) 101  Temp: 97.8 F (36.6 C)  Weight: 116 lb 11.2 oz (52.9 kg)    Fetal Status: Fetal Heart Rate (bpm): 151 Fundal Height: 35 cm Movement: Present  Presentation: Vertex  General:  Alert, oriented and cooperative. Patient is in no acute distress.  Skin: Skin is warm and dry. No rash noted.   Cardiovascular: Normal heart rate noted  Respiratory: Normal respiratory effort, no problems with respiration noted  Abdomen: Soft, gravid, appropriate for gestational age. Pain/Pressure: Absent     Pelvic:  Cervical exam deferred        Extremities: Normal range of motion.  Edema: None  Mental Status: Normal mood and affect. Normal behavior. Normal judgment and thought content.   Urinalysis:      Assessment and Plan:  Pregnancy: [id number] at [redacted]w[redacted]d  1. Supervision of high risk pregnancy in third trimester Pt doing well - Culture, beta strep (group b only) - GC/Chlamydia probe amp ([place])not at [facility] - Strep Gp B NAA - Cervicovaginal ancillary only  2. HSV-2  seropositive Valtrex ppx rx sent in  3. Anemia affecting pregnancy in third trimester Iron sent in  Preterm labor symptoms and general obstetric precautions including but not limited to vaginal bleeding, contractions, leaking of fluid and fetal movement were reviewed in detail with the patient. Please refer to After Visit Summary for other counseling recommendations.  Return in about 2 weeks (around 10/09/2018).   [place] Bing, MD

## 2018-09-26 LAB — GC/CHLAMYDIA PROBE AMP (~~LOC~~) NOT AT ARMC
Chlamydia: NEGATIVE
Neisseria Gonorrhea: NEGATIVE

## 2018-09-27 ENCOUNTER — Telehealth: Payer: Self-pay | Admitting: Family Medicine

## 2018-09-27 NOTE — Telephone Encounter (Signed)
Called patient to get her scheduled for her follow-up appointment on 5/1 @ 10:15. Patient verbalized understanding.

## 2018-09-29 LAB — CULTURE, BETA STREP (GROUP B ONLY): Strep Gp B Culture: NEGATIVE

## 2018-10-09 ENCOUNTER — Telehealth: Payer: Self-pay | Admitting: Medical

## 2018-10-09 NOTE — Telephone Encounter (Signed)
Called the patient to verify if she has access to blood pressure. She stated yes, informed of the visit being changed to virtual visit per notes from Dr. Shawnie Pons. The patient downloaded the app while she was on the line with me. Informed of being called with a code to enter prior to her appointment on Friday.

## 2018-10-10 ENCOUNTER — Inpatient Hospital Stay (HOSPITAL_COMMUNITY)
Admission: EM | Admit: 2018-10-10 | Discharge: 2018-10-12 | DRG: 806 | Disposition: A | Discharge status: Home / Self Care | Payer: Medicaid Other | Attending: Obstetrics & Gynecology | Admitting: Obstetrics & Gynecology

## 2018-10-10 ENCOUNTER — Other Ambulatory Visit: Payer: Self-pay

## 2018-10-10 DIAGNOSIS — Z87891 Personal history of nicotine dependence: Secondary | ICD-10-CM

## 2018-10-10 DIAGNOSIS — R768 Other specified abnormal immunological findings in serum: Secondary | ICD-10-CM | POA: Diagnosis present

## 2018-10-10 DIAGNOSIS — O9832 Other infections with a predominantly sexual mode of transmission complicating childbirth: Secondary | ICD-10-CM | POA: Diagnosis present

## 2018-10-10 DIAGNOSIS — D573 Sickle-cell trait: Secondary | ICD-10-CM | POA: Diagnosis present

## 2018-10-10 DIAGNOSIS — R7689 Other specified abnormal immunological findings in serum: Secondary | ICD-10-CM | POA: Diagnosis present

## 2018-10-10 DIAGNOSIS — Z8751 Personal history of pre-term labor: Secondary | ICD-10-CM

## 2018-10-10 DIAGNOSIS — Z3009 Encounter for other general counseling and advice on contraception: Secondary | ICD-10-CM | POA: Diagnosis present

## 2018-10-10 DIAGNOSIS — A6000 Herpesviral infection of urogenital system, unspecified: Secondary | ICD-10-CM | POA: Diagnosis present

## 2018-10-10 DIAGNOSIS — Z3A38 38 weeks gestation of pregnancy: Secondary | ICD-10-CM

## 2018-10-10 DIAGNOSIS — O99019 Anemia complicating pregnancy, unspecified trimester: Secondary | ICD-10-CM | POA: Diagnosis present

## 2018-10-10 DIAGNOSIS — O9902 Anemia complicating childbirth: Principal | ICD-10-CM | POA: Diagnosis present

## 2018-10-10 NOTE — ED Notes (Signed)
Report given to MAU, pt taken by transport. [redacted] wks pregnant, abd pain

## 2018-10-11 ENCOUNTER — Inpatient Hospital Stay (HOSPITAL_COMMUNITY): Payer: Medicaid Other | Admitting: Anesthesiology

## 2018-10-11 ENCOUNTER — Encounter (HOSPITAL_COMMUNITY): Payer: Self-pay | Admitting: *Deleted

## 2018-10-11 ENCOUNTER — Other Ambulatory Visit: Payer: Self-pay

## 2018-10-11 ENCOUNTER — Encounter: Payer: Self-pay | Admitting: Obstetrics and Gynecology

## 2018-10-11 DIAGNOSIS — Z3A38 38 weeks gestation of pregnancy: Secondary | ICD-10-CM

## 2018-10-11 DIAGNOSIS — A6000 Herpesviral infection of urogenital system, unspecified: Secondary | ICD-10-CM | POA: Diagnosis present

## 2018-10-11 DIAGNOSIS — O9902 Anemia complicating childbirth: Secondary | ICD-10-CM | POA: Diagnosis present

## 2018-10-11 DIAGNOSIS — O26893 Other specified pregnancy related conditions, third trimester: Secondary | ICD-10-CM | POA: Diagnosis present

## 2018-10-11 DIAGNOSIS — O9832 Other infections with a predominantly sexual mode of transmission complicating childbirth: Secondary | ICD-10-CM | POA: Diagnosis present

## 2018-10-11 DIAGNOSIS — D573 Sickle-cell trait: Secondary | ICD-10-CM | POA: Diagnosis present

## 2018-10-11 DIAGNOSIS — Z87891 Personal history of nicotine dependence: Secondary | ICD-10-CM | POA: Diagnosis not present

## 2018-10-11 LAB — TYPE AND SCREEN
ABO/RH(D): A POS
Antibody Screen: NEGATIVE

## 2018-10-11 LAB — CBC
HCT: 28.8 % — ABNORMAL LOW (ref 36.0–46.0)
Hemoglobin: 8.3 g/dL — ABNORMAL LOW (ref 12.0–15.0)
MCH: 18.6 pg — ABNORMAL LOW (ref 26.0–34.0)
MCHC: 28.8 g/dL — ABNORMAL LOW (ref 30.0–36.0)
MCV: 64.6 fL — ABNORMAL LOW (ref 80.0–100.0)
Platelets: 292 10*3/uL (ref 150–400)
RBC: 4.46 MIL/uL (ref 3.87–5.11)
RDW: 18.3 % — ABNORMAL HIGH (ref 11.5–15.5)
WBC: 15.9 10*3/uL — ABNORMAL HIGH (ref 4.0–10.5)
nRBC: 0.6 % — ABNORMAL HIGH (ref 0.0–0.2)

## 2018-10-11 LAB — ABO/RH: ABO/RH(D): A POS

## 2018-10-11 LAB — RPR: RPR Ser Ql: NONREACTIVE

## 2018-10-11 MED ORDER — PHENYLEPHRINE 40 MCG/ML (10ML) SYRINGE FOR IV PUSH (FOR BLOOD PRESSURE SUPPORT): 80.0000 ug | PREFILLED_SYRINGE | INTRAVENOUS | Status: DC | PRN

## 2018-10-11 MED ORDER — PRENATAL MULTIVITAMIN CH
1.0000 | ORAL_TABLET | Freq: Every day | ORAL | Status: DC
  Administered 2018-10-12: 1 via ORAL
  Filled 2018-10-11: qty 1

## 2018-10-11 MED ORDER — HYDROXYZINE HCL 50 MG PO TABS: 50.0000 mg | ORAL_TABLET | Freq: Four times a day (QID) | ORAL | Status: DC | PRN

## 2018-10-11 MED ORDER — MEASLES, MUMPS & RUBELLA VAC IJ SOLR: 0.5000 mL | Freq: Once | INTRAMUSCULAR | Status: DC

## 2018-10-11 MED ORDER — DOCUSATE SODIUM 100 MG PO CAPS
100.0000 mg | ORAL_CAPSULE | Freq: Two times a day (BID) | ORAL | Status: DC
  Administered 2018-10-11 – 2018-10-12 (×2): 100 mg via ORAL
  Filled 2018-10-11 (×2): qty 1

## 2018-10-11 MED ORDER — BISACODYL 10 MG RE SUPP: 10.0000 mg | Freq: Every day | RECTAL | Status: DC | PRN

## 2018-10-11 MED ORDER — DIBUCAINE (PERIANAL) 1 % EX OINT: 1.0000 "application " | TOPICAL_OINTMENT | CUTANEOUS | Status: DC | PRN

## 2018-10-11 MED ORDER — LACTATED RINGERS IV SOLN
INTRAVENOUS | Status: DC
  Administered 2018-10-11: 02:00:00 via INTRAVENOUS

## 2018-10-11 MED ORDER — FLEET ENEMA 7-19 GM/118ML RE ENEM: 1.0000 | ENEMA | RECTAL | Status: DC | PRN

## 2018-10-11 MED ORDER — DIPHENHYDRAMINE HCL 25 MG PO CAPS: 25.0000 mg | ORAL_CAPSULE | Freq: Four times a day (QID) | ORAL | Status: DC | PRN

## 2018-10-11 MED ORDER — LACTATED RINGERS IV SOLN: 500.0000 mL | INTRAVENOUS | Status: DC | PRN

## 2018-10-11 MED ORDER — LIDOCAINE HCL (PF) 1 % IJ SOLN
INTRAMUSCULAR | Status: DC | PRN
  Administered 2018-10-11: 5 mL via EPIDURAL
  Administered 2018-10-11: 4 mL via EPIDURAL

## 2018-10-11 MED ORDER — METHYLERGONOVINE MALEATE 0.2 MG PO TABS: 0.2000 mg | ORAL_TABLET | ORAL | Status: DC | PRN

## 2018-10-11 MED ORDER — FENTANYL CITRATE (PF) 100 MCG/2ML IJ SOLN: 50.0000 ug | INTRAMUSCULAR | Status: DC | PRN

## 2018-10-11 MED ORDER — METHYLERGONOVINE MALEATE 0.2 MG/ML IJ SOLN: 0.2000 mg | INTRAMUSCULAR | Status: DC | PRN

## 2018-10-11 MED ORDER — FENTANYL-BUPIVACAINE-NACL 0.5-0.125-0.9 MG/250ML-% EP SOLN: 12.0000 mL/h | EPIDURAL | Status: DC | PRN

## 2018-10-11 MED ORDER — SIMETHICONE 80 MG PO CHEW: 80.0000 mg | CHEWABLE_TABLET | ORAL | Status: DC | PRN

## 2018-10-11 MED ORDER — OXYTOCIN BOLUS FROM INFUSION
500.0000 mL | Freq: Once | INTRAVENOUS | Status: AC
  Administered 2018-10-11: 04:00:00 500 mL via INTRAVENOUS

## 2018-10-11 MED ORDER — ACETAMINOPHEN 325 MG PO TABS: 650.0000 mg | ORAL_TABLET | ORAL | Status: DC | PRN

## 2018-10-11 MED ORDER — LIDOCAINE HCL (PF) 1 % IJ SOLN: 30.0000 mL | INTRAMUSCULAR | Status: DC | PRN

## 2018-10-11 MED ORDER — ONDANSETRON HCL 4 MG PO TABS: 4.0000 mg | ORAL_TABLET | ORAL | Status: DC | PRN

## 2018-10-11 MED ORDER — SODIUM CHLORIDE (PF) 0.9 % IJ SOLN
INTRAMUSCULAR | Status: DC | PRN
  Administered 2018-10-11: 12 mL/h via EPIDURAL

## 2018-10-11 MED ORDER — OXYCODONE-ACETAMINOPHEN 5-325 MG PO TABS: 1.0000 | ORAL_TABLET | ORAL | Status: DC | PRN

## 2018-10-11 MED ORDER — COCONUT OIL OIL: 1.0000 "application " | TOPICAL_OIL | Status: DC | PRN

## 2018-10-11 MED ORDER — OXYCODONE-ACETAMINOPHEN 5-325 MG PO TABS: 2.0000 | ORAL_TABLET | ORAL | Status: DC | PRN

## 2018-10-11 MED ORDER — LACTATED RINGERS IV SOLN
500.0000 mL | Freq: Once | INTRAVENOUS | Status: AC
  Administered 2018-10-11: 500 mL via INTRAVENOUS

## 2018-10-11 MED ORDER — TETANUS-DIPHTH-ACELL PERTUSSIS 5-2.5-18.5 LF-MCG/0.5 IM SUSP: 0.5000 mL | Freq: Once | INTRAMUSCULAR | Status: DC

## 2018-10-11 MED ORDER — DIPHENHYDRAMINE HCL 50 MG/ML IJ SOLN: 12.5000 mg | INTRAMUSCULAR | Status: DC | PRN

## 2018-10-11 MED ORDER — FENTANYL-BUPIVACAINE-NACL 0.5-0.125-0.9 MG/250ML-% EP SOLN
12.0000 mL/h | EPIDURAL | Status: DC | PRN
  Filled 2018-10-11: qty 250

## 2018-10-11 MED ORDER — ONDANSETRON HCL 4 MG/2ML IJ SOLN: 4.0000 mg | Freq: Four times a day (QID) | INTRAMUSCULAR | Status: DC | PRN

## 2018-10-11 MED ORDER — ONDANSETRON HCL 4 MG/2ML IJ SOLN: 4.0000 mg | INTRAMUSCULAR | Status: DC | PRN

## 2018-10-11 MED ORDER — FERROUS SULFATE 325 (65 FE) MG PO TABS
325.0000 mg | ORAL_TABLET | Freq: Two times a day (BID) | ORAL | Status: DC
  Administered 2018-10-11 – 2018-10-12 (×2): 325 mg via ORAL
  Filled 2018-10-11 (×3): qty 1

## 2018-10-11 MED ORDER — IBUPROFEN 600 MG PO TABS
600.0000 mg | ORAL_TABLET | Freq: Four times a day (QID) | ORAL | Status: DC
  Administered 2018-10-11 – 2018-10-12 (×5): 600 mg via ORAL
  Filled 2018-10-11 (×5): qty 1

## 2018-10-11 MED ORDER — OXYTOCIN 40 UNITS IN NORMAL SALINE INFUSION - SIMPLE MED
2.5000 [IU]/h | INTRAVENOUS | Status: DC
  Filled 2018-10-11: qty 1000

## 2018-10-11 MED ORDER — WITCH HAZEL-GLYCERIN EX PADS: 1.0000 "application " | MEDICATED_PAD | CUTANEOUS | Status: DC | PRN

## 2018-10-11 MED ORDER — SOD CITRATE-CITRIC ACID 500-334 MG/5ML PO SOLN: 30.0000 mL | ORAL | Status: DC | PRN

## 2018-10-11 MED ORDER — FLEET ENEMA 7-19 GM/118ML RE ENEM: 1.0000 | ENEMA | Freq: Every day | RECTAL | Status: DC | PRN

## 2018-10-11 MED ORDER — BENZOCAINE-MENTHOL 20-0.5 % EX AERO: 1.0000 "application " | INHALATION_SPRAY | CUTANEOUS | Status: DC | PRN

## 2018-10-11 MED ORDER — EPHEDRINE 5 MG/ML INJ: 10.0000 mg | INTRAVENOUS | Status: DC | PRN

## 2018-10-11 MED ORDER — ACETAMINOPHEN 325 MG PO TABS
650.0000 mg | ORAL_TABLET | ORAL | Status: DC | PRN
  Administered 2018-10-11: 650 mg via ORAL
  Filled 2018-10-11: qty 2

## 2018-10-11 MED ORDER — ZOLPIDEM TARTRATE 5 MG PO TABS: 5.0000 mg | ORAL_TABLET | Freq: Every evening | ORAL | Status: DC | PRN

## 2018-10-11 NOTE — Anesthesia Preprocedure Evaluation (Signed)
Anesthesia Evaluation  Patient identified by MRN, date of birth, ID band Patient awake    Reviewed: Allergy & Precautions, NPO status , Patient's Chart, lab work & pertinent test results  History of Anesthesia Complications Negative for: history of anesthetic complications  Airway Mallampati: II  TM Distance: >3 FB Neck ROM: Full    Dental   Pulmonary former smoker,    breath sounds clear to auscultation       Cardiovascular negative cardio ROS   Rhythm:Regular Rate:Normal     Neuro/Psych negative neurological ROS  negative psych ROS   GI/Hepatic negative GI ROS, Neg liver ROS,   Endo/Other  negative endocrine ROS  Renal/GU negative Renal ROS     Musculoskeletal negative musculoskeletal ROS (+)   Abdominal   Peds  Hematology  (+) Sickle cell trait and anemia ,   Anesthesia Other Findings   Reproductive/Obstetrics (+) Pregnancy                             Anesthesia Physical Anesthesia Plan  ASA: II  Anesthesia Plan: Epidural   Post-op Pain Management:    Induction:   PONV Risk Score and Plan: 2 and Treatment may vary due to age or medical condition  Airway Management Planned: Natural Airway  Additional Equipment: None  Intra-op Plan:   Post-operative Plan:   Informed Consent: I have reviewed the patients History and Physical, chart, labs and discussed the procedure including the risks, benefits and alternatives for the proposed anesthesia with the patient or authorized representative who has indicated his/her understanding and acceptance.       Plan Discussed with: Anesthesiologist  Anesthesia Plan Comments: (Labs reviewed. Platelets acceptable, patient not taking any blood thinning medications. Per RN, FHR tracing reported to be stable enough for sitting procedure. Risks and benefits discussed with patient, including PDPH, backache, epidural hematoma, failed epidural,  allergic reaction, and nerve injury. Patient expressed understanding and wished to proceed.)        Anesthesia Quick Evaluation

## 2018-10-11 NOTE — MAU Note (Signed)
Pt presents to MAU c/o ctx every 3-86min. No bleeding or LOF. +FM.

## 2018-10-11 NOTE — Anesthesia Procedure Notes (Signed)
Epidural Patient location during procedure: OB Start time: 10/11/2018 3:03 AM End time: 10/11/2018 3:07 AM  Staffing Anesthesiologist: Beryle Lathe, MD Performed: anesthesiologist   Preanesthetic Checklist Completed: patient identified, pre-op evaluation, timeout performed, IV checked, risks and benefits discussed and monitors and equipment checked  Epidural Patient position: sitting Prep: DuraPrep Patient monitoring: continuous pulse ox and blood pressure Approach: midline Location: L3-L4 Injection technique: LOR saline  Needle:  Needle type: Tuohy  Needle gauge: 17 G Needle length: 9 cm Needle insertion depth: 5 cm Catheter size: 19 Gauge Catheter at skin depth: 10 cm Test dose: negative and Other (1% lidocaine)  Assessment Events: blood not aspirated  Additional Notes Patient identified. Risks including, but not limited to, bleeding, infection, nerve damage, paralysis, inadequate analgesia, blood pressure changes, nausea, vomiting, allergic reaction, postpartum back pain, itching, and headache were discussed. Patient expressed understanding and wished to proceed. Sterile prep and drape, including hand hygiene, mask, and sterile gloves were used. The patient was positioned and the spine was prepped. The skin was anesthetized with lidocaine. No paraesthesia or other complication noted. The patient did not experience any signs of intravascular injection such as tinnitus or metallic taste in mouth, nor signs of intrathecal spread such as rapid motor block. Please see nursing notes for vital signs. The patient tolerated the procedure well.   Leslye Peer, MDReason for block:procedure for pain

## 2018-10-11 NOTE — H&P (Signed)
Desiree Graham is a 23 y.o. female [phone] with IUP at [redacted]w[redacted]d presenting for contractions that started about 2 hours ago, quickly getting stronger. Cx changed in MAU from 3 to 4.5 in an hour.  associated with none vaginal bleeding..  Membranes are intact, with active fetal movement.   [facility]  Prenatal History/Complications:  Term SVD X2 w/a 35 week PTD.  Not on 17p this pg.  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 Anemia   . Sickle cell trait [facility])     Past Surgical History: Past Surgical History:  Procedure Laterality Date  . NO PAST SURGERIES      Obstetrical History: OB History    Gravida  4   Para  3   Term  2   Preterm  1   AB  0   Living  3     SAB  0   TAB  0   Ectopic  0   Multiple  0   Live Births  3            Social History: Social History   Socioeconomic History  . Marital status: Single    Spouse name: Not on file  . Number of children: Not on file  . Years of education: Not on file  . Highest education level: Not on file  Occupational History  . Not on file  Social Needs  . Financial resource strain: Not on file  . Food insecurity:    Worry: Never true    Inability: Never true  . Transportation needs:    Medical: No    Non-medical: No  Tobacco Use  . Smoking status: Former Smoker    Last attempt to quit: 04/12/2016    Years since quitting: 2.4  . Smokeless tobacco: Never Used  Substance and Sexual Activity  . Alcohol use: No  . Drug use: No  . Sexual activity: Yes    Birth control/protection: None  Lifestyle  . Physical activity:    Days per week: Not on file    Minutes per session: Not on file  . Stress: Not on file  Relationships  . Social connections:    Talks on phone: Not on file    Gets together: Not on file    Attends religious service: Not on file    Active member of club or organization: Not on file   Attends meetings of clubs or organizations: Not on file    Relationship status: Not on file  Other Topics Concern  . Not on file  Social History Narrative  . Not on file    Family History: Family History  Problem Relation Age of Onset  . Miscarriages / Stillbirths Paternal Aunt     Allergies: No Known Allergies  Medications Prior to Admission  Medication Sig Dispense Refill Last Dose  . ferrous sulfate 325 (65 FE) MG tablet Take 1 tablet (325 mg total) by mouth 2 (two) times daily with a meal for 30 days. 60 tablet 0 10/10/2018 at Unknown time  . Prenatal Vit-Fe Fumarate-FA (PRENATAL MULTIVITAMIN) TABS tablet Take 1 tablet by mouth daily at 12 noon.   10/10/2018 at Unknown time  . valACYclovir (VALTREX) 500 MG tablet Take 1 tablet (500 mg total) by mouth 2 (two) times daily. 90 tablet 0 10/10/2018 at Unknown time        Review of Systems  Constitutional: Negative for fever and chills Eyes: Negative for visual disturbances Respiratory: Negative for shortness of breath, dyspnea Cardiovascular: Negative for chest pain or palpitations  Gastrointestinal: Negative for vomiting, diarrhea and constipation.  POSITIVE for abdominal pain (contractions) Genitourinary: Negative for dysuria and urgency Musculoskeletal: Negative for back pain, joint pain, myalgias  Neurological: Negative for dizziness and headaches      Blood pressure 131/78, pulse 88, temperature 98 F (36.7 C), resp. rate 16, weight 55.3 kg, last menstrual period 01/12/2018, not currently breastfeeding. General appearance: alert, cooperative and mild distress Lungs: clear to auscultation bilaterally Heart: regular rate and rhythm Abdomen: soft, non-tender; bowel sounds normal Vagina:  No visible lesions Extremities: Homans sign is negative, no sign of DVT DTR's 2+ Presentation: cephalic Fetal monitoring  Baseline: 145 bpm, Variability: Good {> 6 bpm), Accelerations: Reactive and Decelerations: Absent Uterine  activity  2-3  Dilation: 4.5 Effacement (%): 70 Station: -3 Exam by:: Zenia ResidesNikki Risheq, RN   Nursing Staff Provider  [facility]  LMP  Language  English Anatomy US  WNL  Flu Vaccine  Declined  Genetic Screen  NIPS: low risks  AFP:  neg    TDaP vaccine    Hgb A1C or  GTT Early 5.2 Third trimester   Rhogam  na   LAB RESULTS   Feeding Plan Bottle  Blood Type A/Positive/-- (11/06 1630)   Contraception  Antibody Negative (11/06 1630)  Circumcision  Rubella 17.30 (11/06 1630)  Pediatrician  [facility] RPR Non Reactive (11/06 1630)   Support Person Melvin HBsAg Negative (11/06 1630)   Prenatal Classes  HIV Non Reactive (11/06 1630)  BTL Consent  GBS neg  VBAC Consent n/a Pap WNL 11/19    Hgb Electro  AS    CF Neg 32    SMA 3 copies    Waterbirth  [ ]  Class [ ]  Consent [ ]  CNM visit     Prenatal labs: ABO, Rh: A/Positive/-- (11/06 1630) Antibody: Negative (11/06 1630) Rubella: 17.30 (11/06 1630) RPR: Non Reactive (03/11 0937)  HBsAg: Negative (11/06 1630)  HIV: Non Reactive (03/11 0937)    Prenatal Transfer Tool  Maternal Diabetes: No Genetic Screening: Normal Maternal Ultrasounds/Referrals: Normal Fetal Ultrasounds or other Referrals:  None Maternal Substance Abuse:  No Significant Maternal Medications:  None Significant Maternal Lab Results: Lab values include: Group B Strep negative     No results found for this or any previous visit (from the past 24 hour(s)).  Assessment: Desiree Graham is a 23 y.o. [phone] with an IUP at 4343w6d presenting for early labor  Plan: #Labor: expectant management #Pain:  Per request epidural #FWB Cat 1 #ID: GBS:   Jacklyn ShellFrances Cresenzo-Dishmon 10/11/2018, 1:20 AM

## 2018-10-11 NOTE — Discharge Summary (Addendum)
Postpartum Discharge Summary     Patient Name: Desiree Graham DOB: 06-24-1995 MRN: [id number]  Date of admission: 10/10/2018 Delivering Provider: Jacklyn Shell   Date of discharge: 10/12/2018  Admitting diagnosis: Contractions Intrauterine pregnancy: [redacted]w[redacted]d     Secondary diagnosis:  Active Problems:   HSV-2 seropositive   History of preterm delivery   Sickle cell trait (HCC)   Anemia affecting pregnancy   Unwanted fertility  Additional problems: none     Discharge diagnosis: Term Pregnancy Delivered and Anemia                                                                                                Post partum procedures:none  Augmentation: none other than AROM at delivery  Complications: None  Hospital course:  Onset of Labor With Vaginal Delivery     23 y.o. yo [phone] at [redacted]w[redacted]d was admitted in Active Labor on 10/10/2018. Patient had an uncomplicated labor course as follows:  Membrane Rupture Time/Date: 4:08 AM ,10/11/2018   Intrapartum Procedures: Episiotomy: None [1]                                         Lacerations:  None [1]  Patient had a delivery of a Viable infant. 10/11/2018  Information for the patient's newborn:  Kemberley, Lucci [[id number]]  Delivery Method: Vaginal, Spontaneous(Filed from Delivery Summary)    Pateint had an uncomplicated postpartum course.  She is ambulating, tolerating a regular diet, passing flatus, and urinating well. Patient is discharged home in stable condition on 10/12/18 per her request for early discharge as long as baby can go too. Pt would like to use Seasonique for contraception until she can get an interval BTL.   Magnesium Sulfate recieved: No BMZ received: No  Physical exam  Vitals:   10/11/18 1210 10/11/18 1654 10/11/18 2143 10/12/18 0628  BP: 98/60 112/74 116/78 104/70  Pulse: 80 77 87 63  Resp: 18 18 16 18   Temp: 98.1 F (36.7 C) 98.8 F (37.1 C) 98.4 F (36.9 C) 98.2 F (36.8 C)  TempSrc:  Oral  Oral Oral  SpO2:   100%   Weight:      Height:       General: alert and cooperative Lochia: appropriate Uterine Fundus: firm Incision: N/A DVT Evaluation: No evidence of DVT seen on physical exam. Labs: Lab Results  Component Value Date   WBC 15.9 (H) 10/11/2018   HGB 8.3 (L) 10/11/2018   HCT 28.8 (L) 10/11/2018   MCV 64.6 (L) 10/11/2018   PLT 292 10/11/2018   No flowsheet data found.  Discharge instruction: per After Visit Summary and "Baby and Me Booklet".  After visit meds:  Allergies as of 10/12/2018   No Known Allergies     Medication List    STOP taking these medications   valACYclovir 500 MG tablet Commonly known as:  VALTREX     TAKE these medications   ferrous sulfate 325 (65 FE) MG tablet Take 1 tablet (325 mg total)  by mouth 2 (two) times daily with a meal for 30 days.   ibuprofen 600 MG tablet Commonly known as:  ADVIL Take 1 tablet (600 mg total) by mouth every 6 (six) hours as needed.   prenatal multivitamin Tabs tablet Take 1 tablet by mouth daily at 12 noon.       Diet: routine diet  Activity: Advance as tolerated. Pelvic rest for 6 weeks.   Outpatient follow up:4 weeks Follow up Appt: No future appointments. Follow up Visit: Follow-up Information    [facility]. Schedule an appointment as soon as possible for a visit in 4 week(s).   Specialty:  Obstetrics and Gynecology Why:  for your postpartum appointment Contact information: [address] [id number] [place] [phone]           Please schedule this patient for Postpartum visit in: 4 weeks with the following provider: Any provider For C/S patients schedule nurse incision check in weeks 2 weeks: no Low risk pregnancy complicated by:  Delivery mode:  SVD Anticipated Birth Control:  Plans Interval BTL PP Procedures needed:   Schedule Integrated BH visit: no      Newborn  Data: Live born female  Birth Weight: 2824 gm (6lb 3.6oz) APGAR: 9, 9  Newborn Delivery   Birth date/time:  10/11/2018 04:17:00 Delivery type:  Vaginal, Spontaneous     Baby Feeding: Bottle Disposition:home with mother   10/12/2018 Arabella MerlesKimberly D Shaw, CNM  9:00 AM

## 2018-10-12 MED ORDER — IBUPROFEN 600 MG PO TABS: 600.0000 mg | ORAL_TABLET | Freq: Four times a day (QID) | ORAL | 0 refills | Status: DC | PRN

## 2018-10-12 NOTE — Discharge Instructions (Signed)
Vaginal Delivery, Care After °Refer to this sheet in the next few weeks. These instructions provide you with information about caring for yourself after vaginal delivery. Your health care provider may also give you more specific instructions. Your treatment has been planned according to current medical practices, but problems sometimes occur. Call your health care provider if you have any problems or questions. °What can I expect after the procedure? °After vaginal delivery, it is common to have: °· Some bleeding from your vagina. °· Soreness in your abdomen, your vagina, and the area of skin between your vaginal opening and your anus (perineum). °· Pelvic cramps. °· Fatigue. °Follow these instructions at home: °Medicines °· Take over-the-counter and prescription medicines only as told by your health care provider. °· If you were prescribed an antibiotic medicine, take it as told by your health care provider. Do not stop taking the antibiotic until it is finished. °Driving ° °· Do not drive or operate heavy machinery while taking prescription pain medicine. °· Do not drive for 24 hours if you received a sedative. °Lifestyle °· Do not drink alcohol. This is especially important if you are breastfeeding or taking medicine to relieve pain. °· Do not use tobacco products, including cigarettes, chewing tobacco, or e-cigarettes. If you need help quitting, ask your health care provider. °Eating and drinking °· Drink at least 8 eight-ounce glasses of water every day unless you are told not to by your health care provider. If you choose to breastfeed your baby, you may need to drink more water than this. °· Eat high-fiber foods every day. These foods may help prevent or relieve constipation. High-fiber foods include: °? Whole grain cereals and breads. °? Brown rice. °? Beans. °? Fresh fruits and vegetables. °Activity °· Return to your normal activities as told by your health care provider. Ask your health care provider what  activities are safe for you. °· Rest as much as possible. Try to rest or take a nap when your baby is sleeping. °· Do not lift anything that is heavier than your baby or 10 lb (4.5 kg) until your health care provider says that it is safe. °· Talk with your health care provider about when you can engage in sexual activity. This may depend on your: °? Risk of infection. °? Rate of healing. °? Comfort and desire to engage in sexual activity. °Vaginal Care °· If you have an episiotomy or a vaginal tear, check the area every day for signs of infection. Check for: °? More redness, swelling, or pain. °? More fluid or blood. °? Warmth. °? Pus or a bad smell. °· Do not use tampons or douches until your health care provider says this is safe. °· Watch for any blood clots that may pass from your vagina. These may look like clumps of dark red, brown, or black discharge. °General instructions °· Keep your perineum clean and dry as told by your health care provider. °· Wear loose, comfortable clothing. °· Wipe from front to back when you use the toilet. °· Ask your health care provider if you can shower or take a bath. If you had an episiotomy or a perineal tear during labor and delivery, your health care provider may tell you not to take baths for a certain length of time. °· Wear a bra that supports your breasts and fits you well. °· If possible, have someone help you with household activities and help care for your baby for at least a few days after you   leave the hospital. °· Keep all follow-up visits for you and your baby as told by your health care provider. This is important. °Contact a health care provider if: °· You have: °? Vaginal discharge that has a bad smell. °? Difficulty urinating. °? Pain when urinating. °? A sudden increase or decrease in the frequency of your bowel movements. °? More redness, swelling, or pain around your episiotomy or vaginal tear. °? More fluid or blood coming from your episiotomy or vaginal  tear. °? Pus or a bad smell coming from your episiotomy or vaginal tear. °? A fever. °? A rash. °? Little or no interest in activities you used to enjoy. °? Questions about caring for yourself or your baby. °· Your episiotomy or vaginal tear feels warm to the touch. °· Your episiotomy or vaginal tear is separating or does not appear to be healing. °· Your breasts are painful, hard, or turn red. °· You feel unusually sad or worried. °· You feel nauseous or you vomit. °· You pass large blood clots from your vagina. If you pass a blood clot from your vagina, save it to show to your health care provider. Do not flush blood clots down the toilet without having your health care provider look at them. °· You urinate more than usual. °· You are dizzy or light-headed. °· You have not breastfed at all and you have not had a menstrual period for 12 weeks after delivery. °· You have stopped breastfeeding and you have not had a menstrual period for 12 weeks after you stopped breastfeeding. °Get help right away if: °· You have: °? Pain that does not go away or does not get better with medicine. °? Chest pain. °? Difficulty breathing. °? Blurred vision or spots in your vision. °? Thoughts about hurting yourself or your baby. °· You develop pain in your abdomen or in one of your legs. °· You develop a severe headache. °· You faint. °· You bleed from your vagina so much that you fill two sanitary pads in one hour. °This information is not intended to replace advice given to you by your health care provider. Make sure you discuss any questions you have with your health care provider. °Document Released: 05/26/2000 Document Revised: 11/10/2015 Document Reviewed: 06/13/2015 °Elsevier Interactive Patient Education © 2019 Else[employer] Inc[employer] ° °

## 2018-10-14 ENCOUNTER — Telehealth: Payer: Self-pay | Admitting: Obstetrics and Gynecology

## 2018-10-14 NOTE — Anesthesia Postprocedure Evaluation (Signed)
Anesthesia Post Note  Patient: Shoko Mcgowan  Procedure(s) Performed: AN AD HOC LABOR EPIDURAL     Patient location: home. Anesthesia Type: Epidural Level of consciousness: awake, awake and alert and oriented Pain management: pain level controlled Vital Signs Assessment: post-procedure vital signs reviewed and stable Respiratory status: spontaneous breathing Cardiovascular status: blood pressure returned to baseline and stable Postop Assessment: no headache, no backache, able to ambulate and adequate PO intake Anesthetic complications: no    Last Vitals:  Vitals:   10/12/18 0628 10/12/18 1418  BP: 104/70 115/60  Pulse: 63 70  Resp: 18 17  Temp: 36.8 C 37 C  SpO2:  100%    Last Pain:  Vitals:   10/12/18 1418  TempSrc: Oral  PainSc:    Pain Goal: Patients Stated Pain Goal: 2 (10/11/18 1836)                 Gladys Damme

## 2018-10-14 NOTE — Telephone Encounter (Signed)
The patient called in for a prescription for seasonique birth control pills.Informed the patient of the postpartum visit, however I will send a message to the nurse about receiving the birth control sooner. Also informed of the virtual visit and the downloading of the cisco webex app. Sending the patient the instructions of the download via mychart.

## 2018-11-08 ENCOUNTER — Telehealth (INDEPENDENT_AMBULATORY_CARE_PROVIDER_SITE_OTHER): Payer: Medicaid Other | Admitting: Obstetrics & Gynecology

## 2018-11-08 ENCOUNTER — Other Ambulatory Visit: Payer: Self-pay

## 2018-11-08 MED ORDER — LEVONORGEST-ETH ESTRAD 91-DAY 0.15-0.03 &0.01 MG PO TABS: 1.0000 | ORAL_TABLET | Freq: Every day | ORAL | 4 refills | Status: DC

## 2018-11-08 NOTE — Progress Notes (Signed)
    I connected with  Desiree Graham on 11/08/18 at  8:15 AM EDT by telephone and verified that I am speaking with the correct person using two identifiers.   I discussed the limitations, risks, security and privacy concerns of performing an evaluation and management service by telephone and the availability of in person appointments. I also discussed with the patient that there may be a patient responsible charge related to this service. The patient expressed understanding and agreed to proceed.  Janene Madeira Chrissie Dacquisto, CMA 11/08/2018  8:28 AM

## 2018-11-08 NOTE — Progress Notes (Signed)
TELEHEALTH POSTPARTUM VIRTUAL VIDEO VISIT ENCOUNTER NOTE  [facility]   I connected with Desiree Graham on 11/08/18 at  8:15 AM EDT by telephone Encounter at home and verified that I am speaking with the correct person using two identifiers.    I discussed the limitations, risks, security and privacy concerns of performing an evaluation and management service by telephone and the availability of in person appointments. I also discussed with the patient that there may be a patient responsible charge related to this service. The patient expressed understanding and agreed to proceed.  Appointment Date: 11/08/2018  Chief Complaint: Postpartum Visit  History of Present Illness: Desiree Graham is a 23 y.o. African-American [id number] being evaluated for postpartum followup.    She is s/p normal spontaneous vaginal delivery at 39 weeks; she was discharged to home on PPD1.  Pregnancy complicated by none. Baby is doing well.  Complains of nothing. She would like a BTL and it is scheduled in July.  Vaginal bleeding or discharge: No  Intercourse: No  Contraception: oral contraceptives (estrogen/progesterone) Mode of feeding infant: Bottle PP depression s/s: No .  Any bowel or bladder issues: No  Pap smear: no abnormalities  Review of Systems:  Her 12 point review of systems is negative or as noted in the History of Present Illness.  Patient Active Problem List   Diagnosis Date Noted  . Indication for care in labor or delivery 10/11/2018  . Anemia affecting pregnancy 09/09/2018  . Unwanted fertility 09/09/2018  . Fundal height low for dates in third trimester 08/21/2018  . Supervision of high-risk pregnancy 04/17/2018  . Sickle cell trait (HCC) 04/17/2018  . History of preterm delivery 11/07/2017  . HSV-2 seropositive 08/25/2017    Medications Iveth Gordy LevanWalton had no medications administered during this visit. Current Outpatient  Medications  Medication Sig Dispense Refill  . ferrous sulfate 325 (65 FE) MG tablet Take 1 tablet (325 mg total) by mouth 2 (two) times daily with a meal for 30 days. 60 tablet 0  . ibuprofen (ADVIL) 600 MG tablet Take 1 tablet (600 mg total) by mouth every 6 (six) hours as needed. (Patient not taking: Reported on 11/08/2018) 30 tablet 0  . Prenatal Vit-Fe Fumarate-FA (PRENATAL MULTIVITAMIN) TABS tablet Take 1 tablet by mouth daily at 12 noon.     No current facility-administered medications for this visit.     Allergies Patient has no known allergies.  Physical Exam:  There were no vitals taken for this visit. Reviewed from Babyscripts General:  Alert, oriented and cooperative. Patient is in no acute distress.  Mental Status: Normal mood and affect. Normal behavior. Normal judgment and thought content.   Respiratory: Normal respiratory effort noted, no problems with respiration noted  Rest of physical exam deferred due to type of encounter  PP Depression Screening:    Assessment:Patient is a 23 y.o. [id number] who is 4 weeks postpartum from a normal spontaneous vaginal delivery.  She is doing well.   Plan: Postpartum state- doing well Contraception- start seasonique today, back up for 4 weeks, plans BTL next month. She signed medicaid form on 09-09-18.   I discussed the assessment and treatment plan with the patient. The patient was provided an opportunity to ask questions and all were answered. The patient agreed with the plan and demonstrated an understanding of the instructions.   The patient was advised to call back or seek an in-person evaluation/go to the ED for any concerning  postpartum symptoms.  I provided 10 minutes of face-to-face time during this encounter.   Allie Bossier, MD [facility], [facility]

## 2018-12-16 ENCOUNTER — Other Ambulatory Visit: Payer: Self-pay

## 2018-12-16 ENCOUNTER — Telehealth: Payer: Medicaid Other | Admitting: Obstetrics & Gynecology

## 2018-12-16 NOTE — Progress Notes (Signed)
I connected with  Desiree Graham on 12/16/18 at  1:15 PM EDT by telephone and verified that I am speaking with the correct person using two identifiers. Patient stated that she doesn't want to continue with the tubal ligation and she "doesn't wanna talk to anyone about it". For me to advise the doctor that she will continue taking her BC pills. Will message Drema Balzarine battle to cancel her surgery.

## 2018-12-24 ENCOUNTER — Ambulatory Visit (HOSPITAL_BASED_OUTPATIENT_CLINIC_OR_DEPARTMENT_OTHER): Admit: 2018-12-24 | Payer: Medicaid Other | Admitting: Obstetrics & Gynecology

## 2018-12-24 ENCOUNTER — Encounter (HOSPITAL_BASED_OUTPATIENT_CLINIC_OR_DEPARTMENT_OTHER): Payer: Self-pay

## 2018-12-24 SURGERY — LIGATION, FALLOPIAN TUBE, LAPAROSCOPIC
Anesthesia: Choice | Laterality: Bilateral

## 2019-01-22 ENCOUNTER — Telehealth: Payer: Self-pay | Admitting: Family Medicine

## 2019-01-22 NOTE — Telephone Encounter (Signed)
Patient called she need a refill on her Birth Control pills

## 2019-01-23 MED ORDER — LEVONORGEST-ETH ESTRAD 91-DAY 0.15-0.03 &0.01 MG PO TABS: 1.0000 | ORAL_TABLET | Freq: Every day | ORAL | 6 refills | Status: DC

## 2019-01-23 NOTE — Telephone Encounter (Signed)
Called and spoke with pt. Pt would like to have her BCP's refilled. Sent prescription to Pharmacy [facility] Patients choosing. Pt reports she has no further questions/concerns at this time.

## 2019-09-25 ENCOUNTER — Encounter: Payer: Self-pay | Admitting: *Deleted

## 2019-09-26 ENCOUNTER — Encounter: Payer: Self-pay | Admitting: Obstetrics & Gynecology

## 2019-09-26 ENCOUNTER — Ambulatory Visit (INDEPENDENT_AMBULATORY_CARE_PROVIDER_SITE_OTHER): Payer: Medicaid Other | Admitting: Obstetrics & Gynecology

## 2019-09-26 ENCOUNTER — Other Ambulatory Visit: Payer: Self-pay

## 2019-09-26 VITALS — BP 119/75 | HR 93 | Wt 94.6 lb

## 2019-09-26 DIAGNOSIS — Z3009 Encounter for other general counseling and advice on contraception: Secondary | ICD-10-CM | POA: Diagnosis not present

## 2019-09-26 NOTE — Patient Instructions (Signed)
Contraception Choices Contraception, also called birth control, refers to methods or devices that prevent pregnancy. Hormonal methods Contraceptive implant  A contraceptive implant is a thin, plastic tube that contains a hormone. It is inserted into the upper part of the arm. It can remain in place for up to 3 years. Progestin-only injections Progestin-only injections are injections of progestin, a synthetic form of the hormone progesterone. They are given every 3 months by a health care provider. Birth control pills  Birth control pills are pills that contain hormones that prevent pregnancy. They must be taken once a day, preferably at the same time each day. Birth control patch  The birth control patch contains hormones that prevent pregnancy. It is placed on the skin and must be changed once a week for three weeks and removed on the fourth week. A prescription is needed to use this method of contraception. Vaginal ring  A vaginal ring contains hormones that prevent pregnancy. It is placed in the vagina for three weeks and removed on the fourth week. After that, the process is repeated with a new ring. A prescription is needed to use this method of contraception. Emergency contraceptive Emergency contraceptives prevent pregnancy after unprotected sex. They come in pill form and can be taken up to 5 days after sex. They work best the sooner they are taken after having sex. Most emergency contraceptives are available without a prescription. This method should not be used as your only form of birth control. Barrier methods Female condom  A female condom is a thin sheath that is worn over the penis during sex. Condoms keep sperm from going inside a woman's body. They can be used with a spermicide to increase their effectiveness. They should be disposed after a single use. Female condom  A female condom is a soft, loose-fitting sheath that is put into the vagina before sex. The condom keeps sperm  from going inside a woman's body. They should be disposed after a single use. Diaphragm  A diaphragm is a soft, dome-shaped barrier. It is inserted into the vagina before sex, along with a spermicide. The diaphragm blocks sperm from entering the uterus, and the spermicide kills sperm. A diaphragm should be left in the vagina for 6-8 hours after sex and removed within 24 hours. A diaphragm is prescribed and fitted by a health care provider. A diaphragm should be replaced every 1-2 years, after giving birth, after gaining more than 15 lb (6.8 kg), and after pelvic surgery. Cervical cap  A cervical cap is a round, soft latex or plastic cup that fits over the cervix. It is inserted into the vagina before sex, along with spermicide. It blocks sperm from entering the uterus. The cap should be left in place for 6-8 hours after sex and removed within 48 hours. A cervical cap must be prescribed and fitted by a health care provider. It should be replaced every 2 years. Sponge  A sponge is a soft, circular piece of polyurethane foam with spermicide on it. The sponge helps block sperm from entering the uterus, and the spermicide kills sperm. To use it, you make it wet and then insert it into the vagina. It should be inserted before sex, left in for at least 6 hours after sex, and removed and thrown away within 30 hours. Spermicides Spermicides are chemicals that kill or block sperm from entering the cervix and uterus. They can come as a cream, jelly, suppository, foam, or tablet. A spermicide should be inserted into the   vagina with an applicator at least 10-15 minutes before sex to allow time for it to work. The process must be repeated every time you have sex. Spermicides do not require a prescription. Intrauterine contraception Intrauterine device (IUD) An IUD is a T-shaped device that is put in a woman's uterus. There are two types:  Hormone IUD.This type contains progestin, a synthetic form of the hormone  progesterone. This type can stay in place for 3-5 years.  Copper IUD.This type is wrapped in copper wire. It can stay in place for 10 years.  Permanent methods of contraception Female tubal ligation In this method, a woman's fallopian tubes are sealed, tied, or blocked during surgery to prevent eggs from traveling to the uterus. Hysteroscopic sterilization In this method, a small, flexible insert is placed into each fallopian tube. The inserts cause scar tissue to form in the fallopian tubes and block them, so sperm cannot reach an egg. The procedure takes about 3 months to be effective. Another form of birth control must be used during those 3 months. Female sterilization This is a procedure to tie off the tubes that carry sperm (vasectomy). After the procedure, the man can still ejaculate fluid (semen). Natural planning methods Natural family planning In this method, a couple does not have sex on days when the woman could become pregnant. Calendar method This means keeping track of the length of each menstrual cycle, identifying the days when pregnancy can happen, and not having sex on those days. Ovulation method In this method, a couple avoids sex during ovulation. Symptothermal method This method involves not having sex during ovulation. The woman typically checks for ovulation by watching changes in her temperature and in the consistency of cervical mucus. Post-ovulation method In this method, a couple waits to have sex until after ovulation. Summary  Contraception, also called birth control, means methods or devices that prevent pregnancy.  Hormonal methods of contraception include implants, injections, pills, patches, vaginal rings, and emergency contraceptives.  Barrier methods of contraception can include female condoms, female condoms, diaphragms, cervical caps, sponges, and spermicides.  There are two types of IUDs (intrauterine devices). An IUD can be put in a woman's uterus to  prevent pregnancy for 3-5 years.  Permanent sterilization can be done through a procedure for males, females, or both.  Natural family planning methods involve not having sex on days when the woman could become pregnant. This information is not intended to replace advice given to you by your health care provider. Make sure you discuss any questions you have with your health care provider. Document Revised: 05/31/2017 Document Reviewed: 07/01/2016 Elsevier Patient Education  2020 Elsevier Inc.  Laparoscopic Tubal Ligation Laparoscopic tubal ligation is a procedure to close the fallopian tubes. This is done so that you cannot get pregnant. When the fallopian tubes are closed, the eggs that your ovaries release cannot enter the uterus, and sperm cannot reach the released eggs. You should not have this procedure if you want to get pregnant someday or if you are unsure about having more children. Tell a health care provider about:  Any allergies you have.  All medicines you are taking, including vitamins, herbs, eye drops, creams, and over-the-counter medicines.  Any problems you or family members have had with anesthetic medicines.  Any blood disorders you have.  Any surgeries you have had.  Any medical conditions you have.  Whether you are pregnant or may be pregnant.  Any past pregnancies. What are the risks? Generally, this is a  safe procedure. However, problems may occur, including:  Infection.  Bleeding.  Injury to other organs in the abdomen.  Side effects from anesthetic medicines.  Failure of the procedure. This procedure can increase your risk of a kind of pregnancy in which a fertilized egg attaches to the outside of the uterus (ectopic pregnancy). What happens before the procedure? Medicines  Ask your health care provider about: ? Changing or stopping your regular medicines. This is especially important if you are taking diabetes medicines or blood  thinners. ? Taking medicines such as aspirin and ibuprofen. These medicines can thin your blood. Do not take these medicines unless your health care provider tells you to take them. ? Taking over-the-counter medicines, vitamins, herbs, and supplements. Staying hydrated  Follow instructions from your health care provider about hydration, which may include: ? Up to 2 hours before the procedure - you may continue to drink clear liquids, such as water, clear fruit juice, black coffee, and plain tea. Eating and drinking  Follow instructions from your health care provider about eating and drinking, which may include: ? 8 hours before the procedure - stop eating heavy meals or foods, such as meat, fried foods, or fatty foods. ? 6 hours before the procedure - stop eating light meals or foods, such as toast or cereal. ? 6 hours before the procedure - stop drinking milk or drinks that contain milk. ? 2 hours before the procedure - stop drinking clear liquids. General instructions  Do not use any products that contain nicotine or tobacco for at least 4 weeks before the procedure. These products include cigarettes, e-cigarettes, and chewing tobacco. If you need help quitting, ask your health care provider.  Plan to have someone take you home from the hospital.  If you will be going home right after the procedure, plan to have someone with you for 24 hours.  Ask your health care provider: ? How your surgery site will be marked. ? What steps will be taken to help prevent infection. These may include:  Removing hair at the surgery site.  Washing skin with a germ-killing soap.  Taking antibiotic medicine. What happens during the procedure?      An IV will be inserted into one of your veins.  You will be given one or more of the following: ? A medicine to help you relax (sedative). ? A medicine to numb the area (local anesthetic). ? A medicine to make you fall asleep (general  anesthetic). ? A medicine that is injected into an area of your body to numb everything below the injection site (regional anesthetic).  Your bladder may be emptied with a small tube (catheter).  If you have been given a general anesthetic, a tube will be put down your throat to help you breathe.  Two small incisions will be made in your lower abdomen and near your belly button.  Your abdomen will be inflated with a gas. This will let the surgeon see better and will give the surgeon room to work.  A thin, lighted tube (laparoscope) with a camera attached will be inserted into your abdomen through one of the incisions. Small instruments will be inserted through the other incision.  The fallopian tubes will be tied off, burned (cauterized), or blocked with a clip, ring, or clamp. A small portion in the center of each fallopian tube may be removed.  The gas will be released from the abdomen.  The incisions will be closed with stitches (sutures).  A bandage (  dressing) will be placed over the incisions. The procedure may vary among health care providers and hospitals. What happens after the procedure?  Your blood pressure, heart rate, breathing rate, and blood oxygen level will be monitored until you leave the hospital.  You will be given medicine to help with pain, nausea, and vomiting as needed. Summary  Laparoscopic tubal ligation is a procedure that is done so that you cannot get pregnant.  You should not have this procedure if you want to get pregnant someday or if you are unsure about having more children.  The procedure is done using a thin, lighted tube (laparoscope) with a camera attached that will be inserted into your abdomen through an incision.  Follow instructions from your health care provider about eating and drinking before the procedure. This information is not intended to replace advice given to you by your health care provider. Make sure you discuss any questions you  have with your health care provider. Document Revised: 11/05/2018 Document Reviewed: 04/23/2018 Elsevier Patient Education  2020 [employer].

## 2019-09-26 NOTE — Progress Notes (Signed)
   GYNECOLOGY OFFICE VISIT NOTE  History:   Desiree Graham is a 24 y.o. [phone] here today for consultation about possible sterilization. Was scheduled for BTL in past but she changed her mind.  She wants to discuss this again.  Already on OCPs but does not want to take daily pills.  She denies any abnormal vaginal discharge, bleeding, pelvic pain or other concerns.    Past Medical History:  Diagnosis Date  . Anemia   . Sickle cell trait [facility])     Past Surgical History:  Procedure Laterality Date  . NO PAST SURGERIES      The following portions of the patient's history were reviewed and updated as appropriate: allergies, current medications, past family history, past medical history, past social history, past surgical history and problem list.   Health Maintenance:  Normal pap on 04/17/2018.   Review of Systems:  Pertinent items noted in HPI and remainder of comprehensive ROS otherwise negative.  Physical Exam:  BP 119/75   Pulse 93   Wt 94 lb 9.6 oz (42.9 kg)   LMP 08/25/2019 (Approximate)   Breastfeeding No   BMI 17.30 kg/m  CONSTITUTIONAL: Well-developed, well-nourished female in no acute distress.  HEENT:  Normocephalic, atraumatic. External right and left ear normal. No scleral icterus.  NECK: Normal range of motion, supple, no masses noted on observation SKIN: No rash noted. Not diaphoretic. No erythema. No pallor. MUSCULOSKELETAL: Normal range of motion. No edema noted. NEUROLOGIC: Alert and oriented to person, place, and time. Normal muscle tone coordination. No cranial nerve deficit noted. PSYCHIATRIC: Normal mood and affect. Normal behavior. Normal judgment and thought content. CARDIOVASCULAR: Normal heart rate noted RESPIRATORY: Effort and breath sounds normal, no problems with respiration noted ABDOMEN: No masses noted. No other overt distention noted.   PELVIC: Deferred     Assessment and Plan:    1. General counseling and advice on female  contraception Discussed laparoscopic tubal ligation but patient did not realize this would involve invasive surgery.  She is very anxious about undergoing surgery.  Reviewed other long term forms of birth control options available including  hormonal contraceptive medication via patch, ring, injection,contraceptive implant; hormonal and nonhormonal IUDs.  Risks and benefits reviewed.  Questions were answered.  Information was given to patient to review.  She is leaning towards hormonal IUD insertion, was advised to continue OCPs for now until she returns for placement.  Placement will be scheduled for 2 weeks from now, she was told to call/notify us if she wants to go with another modality. Routine preventative health maintenance measures emphasized. Please refer to After Visit Summary for other counseling recommendations.   Return in about 2 weeks (around 10/10/2019) for Liletta IUD insertion.    Total face-to-face time with patient: 20 minutes.  Over 50% of encounter was spent on counseling and coordination of care.   Jaynie Collins, MD, FACOG Obstetrician & Gynecologist, [facility] for [employer], [facility]

## 2019-11-26 ENCOUNTER — Ambulatory Visit: Payer: Medicaid Other | Admitting: Obstetrics & Gynecology

## 2020-06-12 NOTE — L&D Delivery Note (Addendum)
OB/GYN Faculty Practice Delivery Note  Desiree Graham is a 25 y.o. D3P1225 s/p VD at [redacted]w[redacted]d. She was admitted for SOL.   ROM: 1h 61m with clear fluid GBS Status: Negative   Maximum Maternal Temperature:  Temp (24hrs), Avg:98.2 F (36.8 C), Min:97.6 F (36.4 C), Max:98.6 F (37 C)    Labor Progress: Patient arrived at 6 cm dilation and was induced with AROM.   Delivery Date/Time: 03/06/2021 at 1224 Delivery: Called to room and patient was complete and pushing. Fetus extremely difficult to trace during contractions. Feel that the tracing was picking up maternal HR intermittently. Head delivered in direct OP position. No nuchal cord present. Shoulder and body delivered in usual fashion. Gush of blood delivered with baby concerning for partial abruption. Infant with spontaneous cry, placed on mother's abdomen, dried and stimulated. Cord clamped x 2 after 1-minute delay, and cut by the mother. Cord blood drawn. Placenta delivered spontaneously with gentle cord traction. Fundus firm with massage and Pitocin. Labia, perineum, vagina, and cervix inspected with no lacerations.   Placenta: Spontaneous, intact, three vessel cord. Notably small placenta  Complications: Partial placental abruption  Lacerations: None  EBL: 132 mL Analgesia: Epidural    Infant: APGAR (1 MIN): 8   APGAR (5 MINS): 9   APGAR (10 MINS):    Weight: Pending   Derrel Nip, MD  PGY-3, [facility]  03/06/2021 12:49 PM  I was present for the entire delivery of baby and placenta and repair and agree with above. NICU present at delivery due to questionable decels vs tracing MHR and informed of possible VSD on [place]. Infant vigorous immediately at birth. NICU exam not indicated. Peds will determine POC for possible VSD.   Katrinka Blazing, [place], CNM 03/06/2021 1:58 PM

## 2020-07-27 ENCOUNTER — Other Ambulatory Visit: Payer: Self-pay

## 2020-07-27 ENCOUNTER — Ambulatory Visit: Payer: Medicaid Other | Admitting: Certified Nurse Midwife

## 2020-09-21 LAB — OB RESULTS CONSOLE HIV ANTIBODY (ROUTINE TESTING): HIV: NONREACTIVE

## 2020-09-21 LAB — OB RESULTS CONSOLE HEPATITIS B SURFACE ANTIGEN: Hepatitis B Surface Ag: NEGATIVE

## 2020-09-21 LAB — OB RESULTS CONSOLE RUBELLA ANTIBODY, IGM: Rubella: IMMUNE

## 2020-09-21 LAB — OB RESULTS CONSOLE RPR: RPR: NONREACTIVE

## 2020-09-21 LAB — HEPATITIS C ANTIBODY: HCV Ab: NEGATIVE

## 2020-09-23 ENCOUNTER — Ambulatory Visit: Payer: Medicaid Other

## 2020-09-27 ENCOUNTER — Other Ambulatory Visit: Payer: Self-pay | Admitting: Obstetrics & Gynecology

## 2020-09-27 DIAGNOSIS — Z369 Encounter for antenatal screening, unspecified: Secondary | ICD-10-CM

## 2020-09-28 ENCOUNTER — Other Ambulatory Visit: Payer: Self-pay | Admitting: Obstetrics & Gynecology

## 2020-09-28 ENCOUNTER — Ambulatory Visit: Payer: Medicaid Other

## 2020-09-28 DIAGNOSIS — Z363 Encounter for antenatal screening for malformations: Secondary | ICD-10-CM

## 2020-10-01 ENCOUNTER — Ambulatory Visit: Payer: Medicaid Other

## 2020-10-01 ENCOUNTER — Other Ambulatory Visit: Payer: Medicaid Other

## 2020-11-04 ENCOUNTER — Ambulatory Visit (HOSPITAL_BASED_OUTPATIENT_CLINIC_OR_DEPARTMENT_OTHER): Payer: Medicaid Other

## 2020-11-04 ENCOUNTER — Encounter: Payer: Self-pay | Admitting: *Deleted

## 2020-11-04 ENCOUNTER — Other Ambulatory Visit: Payer: Self-pay

## 2020-11-04 ENCOUNTER — Other Ambulatory Visit: Payer: Medicaid Other

## 2020-11-04 ENCOUNTER — Ambulatory Visit: Payer: Medicaid Other

## 2020-11-04 ENCOUNTER — Ambulatory Visit: Payer: Medicaid Other | Attending: Obstetrics & Gynecology | Admitting: *Deleted

## 2020-11-04 VITALS — BP 101/65 | HR 78

## 2020-11-04 DIAGNOSIS — D649 Anemia, unspecified: Secondary | ICD-10-CM | POA: Diagnosis not present

## 2020-11-04 DIAGNOSIS — Z3A20 20 weeks gestation of pregnancy: Secondary | ICD-10-CM | POA: Diagnosis not present

## 2020-11-04 DIAGNOSIS — Z363 Encounter for antenatal screening for malformations: Secondary | ICD-10-CM | POA: Diagnosis not present

## 2020-11-04 DIAGNOSIS — D573 Sickle-cell trait: Secondary | ICD-10-CM | POA: Diagnosis not present

## 2020-11-04 DIAGNOSIS — O99012 Anemia complicating pregnancy, second trimester: Secondary | ICD-10-CM | POA: Insufficient documentation

## 2020-11-04 DIAGNOSIS — O09899 Supervision of other high risk pregnancies, unspecified trimester: Secondary | ICD-10-CM

## 2020-11-04 DIAGNOSIS — O09292 Supervision of pregnancy with other poor reproductive or obstetric history, second trimester: Secondary | ICD-10-CM | POA: Insufficient documentation

## 2020-11-04 DIAGNOSIS — O98512 Other viral diseases complicating pregnancy, second trimester: Secondary | ICD-10-CM | POA: Insufficient documentation

## 2020-11-04 DIAGNOSIS — B009 Herpesviral infection, unspecified: Secondary | ICD-10-CM | POA: Diagnosis not present

## 2020-11-05 ENCOUNTER — Other Ambulatory Visit: Payer: Self-pay | Admitting: *Deleted

## 2020-11-05 DIAGNOSIS — Z362 Encounter for other antenatal screening follow-up: Secondary | ICD-10-CM

## 2020-12-02 ENCOUNTER — Encounter: Payer: Self-pay | Admitting: *Deleted

## 2020-12-02 ENCOUNTER — Ambulatory Visit: Payer: Medicaid Other | Attending: Obstetrics

## 2020-12-02 ENCOUNTER — Other Ambulatory Visit: Payer: Self-pay | Admitting: *Deleted

## 2020-12-02 ENCOUNTER — Other Ambulatory Visit: Payer: Self-pay

## 2020-12-02 ENCOUNTER — Ambulatory Visit: Payer: Medicaid Other | Admitting: *Deleted

## 2020-12-02 VITALS — BP 111/57 | HR 89

## 2020-12-02 DIAGNOSIS — O09899 Supervision of other high risk pregnancies, unspecified trimester: Secondary | ICD-10-CM

## 2020-12-02 DIAGNOSIS — Z362 Encounter for other antenatal screening follow-up: Secondary | ICD-10-CM

## 2020-12-23 ENCOUNTER — Ambulatory Visit: Payer: Medicaid Other

## 2020-12-24 ENCOUNTER — Ambulatory Visit: Payer: Medicaid Other

## 2021-01-03 LAB — OB RESULTS CONSOLE RPR: RPR: NONREACTIVE

## 2021-01-06 ENCOUNTER — Other Ambulatory Visit (HOSPITAL_COMMUNITY): Payer: Self-pay | Admitting: *Deleted

## 2021-01-07 ENCOUNTER — Other Ambulatory Visit: Payer: Self-pay

## 2021-01-07 ENCOUNTER — Ambulatory Visit (HOSPITAL_COMMUNITY)
Admission: RE | Admit: 2021-01-07 | Discharge: 2021-01-07 | Disposition: A | Discharge status: Home / Self Care | Payer: Medicaid Other | Source: Ambulatory Visit | Attending: Obstetrics & Gynecology | Admitting: Obstetrics & Gynecology

## 2021-01-07 DIAGNOSIS — D509 Iron deficiency anemia, unspecified: Secondary | ICD-10-CM | POA: Insufficient documentation

## 2021-01-07 DIAGNOSIS — Z3A00 Weeks of gestation of pregnancy not specified: Secondary | ICD-10-CM | POA: Insufficient documentation

## 2021-01-07 DIAGNOSIS — O99013 Anemia complicating pregnancy, third trimester: Secondary | ICD-10-CM | POA: Diagnosis not present

## 2021-01-07 MED ORDER — SODIUM CHLORIDE 0.9 % IV SOLN
510.0000 mg | INTRAVENOUS | Status: DC
  Filled 2021-01-07: qty 17

## 2021-01-07 MED ORDER — SODIUM CHLORIDE 0.9 % IV SOLN
300.0000 mg | INTRAVENOUS | Status: DC
  Administered 2021-01-07: 300 mg via INTRAVENOUS
  Filled 2021-01-07: qty 15

## 2021-01-14 ENCOUNTER — Ambulatory Visit (HOSPITAL_COMMUNITY)
Admission: RE | Admit: 2021-01-14 | Discharge: 2021-01-14 | Disposition: A | Discharge status: Home / Self Care | Payer: Medicaid Other | Source: Ambulatory Visit | Attending: Obstetrics & Gynecology | Admitting: Obstetrics & Gynecology

## 2021-01-14 ENCOUNTER — Other Ambulatory Visit: Payer: Self-pay

## 2021-01-14 DIAGNOSIS — Z3A00 Weeks of gestation of pregnancy not specified: Secondary | ICD-10-CM | POA: Diagnosis not present

## 2021-01-14 DIAGNOSIS — O99019 Anemia complicating pregnancy, unspecified trimester: Secondary | ICD-10-CM | POA: Diagnosis present

## 2021-01-14 MED ORDER — SODIUM CHLORIDE 0.9 % IV SOLN
300.0000 mg | INTRAVENOUS | Status: AC
  Administered 2021-01-14: 300 mg via INTRAVENOUS
  Filled 2021-01-14: qty 15

## 2021-01-24 ENCOUNTER — Ambulatory Visit: Payer: Medicaid Other | Attending: Obstetrics and Gynecology | Admitting: *Deleted

## 2021-01-24 ENCOUNTER — Ambulatory Visit (HOSPITAL_BASED_OUTPATIENT_CLINIC_OR_DEPARTMENT_OTHER): Payer: Medicaid Other

## 2021-01-24 ENCOUNTER — Other Ambulatory Visit: Payer: Self-pay

## 2021-01-24 VITALS — BP 111/61 | HR 86

## 2021-01-24 DIAGNOSIS — O09293 Supervision of pregnancy with other poor reproductive or obstetric history, third trimester: Secondary | ICD-10-CM | POA: Diagnosis present

## 2021-01-24 DIAGNOSIS — Z8751 Personal history of pre-term labor: Secondary | ICD-10-CM

## 2021-01-24 DIAGNOSIS — O99012 Anemia complicating pregnancy, second trimester: Secondary | ICD-10-CM | POA: Diagnosis not present

## 2021-01-24 DIAGNOSIS — D573 Sickle-cell trait: Secondary | ICD-10-CM | POA: Insufficient documentation

## 2021-01-24 DIAGNOSIS — Z3A31 31 weeks gestation of pregnancy: Secondary | ICD-10-CM | POA: Insufficient documentation

## 2021-01-24 DIAGNOSIS — O98513 Other viral diseases complicating pregnancy, third trimester: Secondary | ICD-10-CM | POA: Diagnosis not present

## 2021-01-24 DIAGNOSIS — B009 Herpesviral infection, unspecified: Secondary | ICD-10-CM | POA: Insufficient documentation

## 2021-01-24 DIAGNOSIS — O09899 Supervision of other high risk pregnancies, unspecified trimester: Secondary | ICD-10-CM

## 2021-02-15 ENCOUNTER — Telehealth: Payer: Self-pay

## 2021-02-15 NOTE — Telephone Encounter (Signed)
FETAL ECHO SCHEDULED FOR 03/02/2021@915A .

## 2021-02-23 LAB — OB RESULTS CONSOLE GBS: GBS: NEGATIVE

## 2021-03-05 ENCOUNTER — Other Ambulatory Visit: Payer: Self-pay

## 2021-03-05 ENCOUNTER — Encounter (HOSPITAL_COMMUNITY): Payer: Self-pay | Admitting: Obstetrics and Gynecology

## 2021-03-05 ENCOUNTER — Inpatient Hospital Stay (EMERGENCY_DEPARTMENT_HOSPITAL)
Admission: AD | Admit: 2021-03-05 | Discharge: 2021-03-06 | Disposition: A | Discharge status: Home / Self Care | Payer: Medicaid Other | Source: Home / Self Care | Attending: Obstetrics and Gynecology | Admitting: Obstetrics and Gynecology

## 2021-03-05 DIAGNOSIS — Z3A37 37 weeks gestation of pregnancy: Secondary | ICD-10-CM | POA: Insufficient documentation

## 2021-03-05 DIAGNOSIS — O471 False labor at or after 37 completed weeks of gestation: Secondary | ICD-10-CM | POA: Insufficient documentation

## 2021-03-05 DIAGNOSIS — O479 False labor, unspecified: Secondary | ICD-10-CM

## 2021-03-05 NOTE — MAU Note (Signed)
Reports contractions since 6pm that are now every 5 minutes and rating them a 3/10. Denies vaginal bleeding or leaking of fluid. +FM.

## 2021-03-06 ENCOUNTER — Other Ambulatory Visit: Payer: Self-pay

## 2021-03-06 ENCOUNTER — Inpatient Hospital Stay (HOSPITAL_COMMUNITY): Payer: Medicaid Other | Admitting: Anesthesiology

## 2021-03-06 ENCOUNTER — Inpatient Hospital Stay (HOSPITAL_COMMUNITY)
Admission: AD | Admit: 2021-03-06 | Discharge: 2021-03-08 | DRG: 807 | Disposition: A | Discharge status: Home / Self Care | Payer: Medicaid Other | Attending: Obstetrics and Gynecology | Admitting: Obstetrics and Gynecology

## 2021-03-06 ENCOUNTER — Encounter (HOSPITAL_COMMUNITY): Payer: Self-pay | Admitting: Obstetrics and Gynecology

## 2021-03-06 DIAGNOSIS — Z3A37 37 weeks gestation of pregnancy: Secondary | ICD-10-CM

## 2021-03-06 DIAGNOSIS — D573 Sickle-cell trait: Secondary | ICD-10-CM | POA: Diagnosis present

## 2021-03-06 DIAGNOSIS — O4593 Premature separation of placenta, unspecified, third trimester: Principal | ICD-10-CM | POA: Diagnosis present

## 2021-03-06 DIAGNOSIS — O26843 Uterine size-date discrepancy, third trimester: Secondary | ICD-10-CM | POA: Diagnosis present

## 2021-03-06 DIAGNOSIS — O4590 Premature separation of placenta, unspecified, unspecified trimester: Secondary | ICD-10-CM

## 2021-03-06 DIAGNOSIS — Z20822 Contact with and (suspected) exposure to covid-19: Secondary | ICD-10-CM | POA: Diagnosis present

## 2021-03-06 DIAGNOSIS — O9902 Anemia complicating childbirth: Secondary | ICD-10-CM | POA: Diagnosis present

## 2021-03-06 DIAGNOSIS — O9852 Other viral diseases complicating childbirth: Secondary | ICD-10-CM | POA: Diagnosis not present

## 2021-03-06 DIAGNOSIS — Z87891 Personal history of nicotine dependence: Secondary | ICD-10-CM | POA: Diagnosis not present

## 2021-03-06 DIAGNOSIS — O26893 Other specified pregnancy related conditions, third trimester: Secondary | ICD-10-CM | POA: Diagnosis present

## 2021-03-06 DIAGNOSIS — R768 Other specified abnormal immunological findings in serum: Secondary | ICD-10-CM | POA: Diagnosis present

## 2021-03-06 DIAGNOSIS — O471 False labor at or after 37 completed weeks of gestation: Secondary | ICD-10-CM | POA: Diagnosis not present

## 2021-03-06 LAB — RESP PANEL BY RT-PCR (FLU A&B, COVID) ARPGX2
Influenza A by PCR: NEGATIVE
Influenza B by PCR: NEGATIVE
SARS Coronavirus 2 by RT PCR: NEGATIVE

## 2021-03-06 LAB — CBC
HCT: 37.9 % (ref 36.0–46.0)
Hemoglobin: 11.8 g/dL — ABNORMAL LOW (ref 12.0–15.0)
MCH: 22.1 pg — ABNORMAL LOW (ref 26.0–34.0)
MCHC: 31.1 g/dL (ref 30.0–36.0)
MCV: 70.8 fL — ABNORMAL LOW (ref 80.0–100.0)
Platelets: 284 10*3/uL (ref 150–400)
RBC: 5.35 MIL/uL — ABNORMAL HIGH (ref 3.87–5.11)
RDW: 23.2 % — ABNORMAL HIGH (ref 11.5–15.5)
WBC: 12.2 10*3/uL — ABNORMAL HIGH (ref 4.0–10.5)
nRBC: 0 % (ref 0.0–0.2)

## 2021-03-06 LAB — TYPE AND SCREEN
ABO/RH(D): A POS
Antibody Screen: NEGATIVE

## 2021-03-06 MED ORDER — LACTATED RINGERS IV SOLN: INTRAVENOUS | Status: DC

## 2021-03-06 MED ORDER — ONDANSETRON HCL 4 MG/2ML IJ SOLN: 4.0000 mg | INTRAMUSCULAR | Status: DC | PRN

## 2021-03-06 MED ORDER — DIPHENHYDRAMINE HCL 50 MG/ML IJ SOLN: 12.5000 mg | INTRAMUSCULAR | Status: DC | PRN

## 2021-03-06 MED ORDER — WITCH HAZEL-GLYCERIN EX PADS: 1.0000 "application " | MEDICATED_PAD | CUTANEOUS | Status: DC | PRN

## 2021-03-06 MED ORDER — PHENYLEPHRINE 40 MCG/ML (10ML) SYRINGE FOR IV PUSH (FOR BLOOD PRESSURE SUPPORT)
80.0000 ug | PREFILLED_SYRINGE | INTRAVENOUS | Status: DC | PRN
  Filled 2021-03-06 (×2): qty 10

## 2021-03-06 MED ORDER — ONDANSETRON HCL 4 MG PO TABS: 4.0000 mg | ORAL_TABLET | ORAL | Status: DC | PRN

## 2021-03-06 MED ORDER — FENTANYL CITRATE (PF) 100 MCG/2ML IJ SOLN: 100.0000 ug | Freq: Once | INTRAMUSCULAR | Status: DC

## 2021-03-06 MED ORDER — EPHEDRINE 5 MG/ML INJ
10.0000 mg | INTRAVENOUS | Status: DC | PRN
  Filled 2021-03-06: qty 2

## 2021-03-06 MED ORDER — SOD CITRATE-CITRIC ACID 500-334 MG/5ML PO SOLN: 30.0000 mL | ORAL | Status: DC | PRN

## 2021-03-06 MED ORDER — ZOLPIDEM TARTRATE 5 MG PO TABS: 5.0000 mg | ORAL_TABLET | Freq: Every evening | ORAL | Status: DC | PRN

## 2021-03-06 MED ORDER — PHENYLEPHRINE 40 MCG/ML (10ML) SYRINGE FOR IV PUSH (FOR BLOOD PRESSURE SUPPORT): 80.0000 ug | PREFILLED_SYRINGE | INTRAVENOUS | Status: DC | PRN

## 2021-03-06 MED ORDER — ACETAMINOPHEN 325 MG PO TABS
650.0000 mg | ORAL_TABLET | ORAL | Status: DC | PRN
  Administered 2021-03-06: 650 mg via ORAL
  Filled 2021-03-06: qty 2

## 2021-03-06 MED ORDER — DIPHENHYDRAMINE HCL 25 MG PO CAPS: 25.0000 mg | ORAL_CAPSULE | Freq: Four times a day (QID) | ORAL | Status: DC | PRN

## 2021-03-06 MED ORDER — FENTANYL CITRATE (PF) 100 MCG/2ML IJ SOLN
INTRAMUSCULAR | Status: AC
  Filled 2021-03-06: qty 2

## 2021-03-06 MED ORDER — OXYTOCIN-SODIUM CHLORIDE 30-0.9 UT/500ML-% IV SOLN
INTRAVENOUS | Status: AC
  Filled 2021-03-06: qty 500

## 2021-03-06 MED ORDER — FENTANYL-BUPIVACAINE-NACL 0.5-0.125-0.9 MG/250ML-% EP SOLN
12.0000 mL/h | EPIDURAL | Status: DC | PRN
  Administered 2021-03-06: 12 mL/h via EPIDURAL

## 2021-03-06 MED ORDER — FENTANYL-BUPIVACAINE-NACL 0.5-0.125-0.9 MG/250ML-% EP SOLN
EPIDURAL | Status: AC
  Filled 2021-03-06: qty 250

## 2021-03-06 MED ORDER — LACTATED RINGERS IV SOLN: 500.0000 mL | INTRAVENOUS | Status: DC | PRN

## 2021-03-06 MED ORDER — FENTANYL CITRATE (PF) 100 MCG/2ML IJ SOLN
50.0000 ug | Freq: Once | INTRAMUSCULAR | Status: DC
Start: 2021-03-06 — End: 2021-03-06

## 2021-03-06 MED ORDER — OXYTOCIN-SODIUM CHLORIDE 30-0.9 UT/500ML-% IV SOLN: 2.5000 [IU]/h | INTRAVENOUS | Status: DC

## 2021-03-06 MED ORDER — BENZOCAINE-MENTHOL 20-0.5 % EX AERO
1.0000 "application " | INHALATION_SPRAY | CUTANEOUS | Status: DC | PRN
  Administered 2021-03-06: 1 via TOPICAL
  Filled 2021-03-06: qty 56

## 2021-03-06 MED ORDER — SENNOSIDES-DOCUSATE SODIUM 8.6-50 MG PO TABS
2.0000 | ORAL_TABLET | ORAL | Status: DC
  Administered 2021-03-07: 2 via ORAL
  Filled 2021-03-06: qty 2

## 2021-03-06 MED ORDER — LIDOCAINE HCL (PF) 1 % IJ SOLN: 30.0000 mL | INTRAMUSCULAR | Status: DC | PRN

## 2021-03-06 MED ORDER — ACETAMINOPHEN 325 MG PO TABS: 650.0000 mg | ORAL_TABLET | ORAL | Status: DC | PRN

## 2021-03-06 MED ORDER — SIMETHICONE 80 MG PO CHEW: 80.0000 mg | CHEWABLE_TABLET | ORAL | Status: DC | PRN

## 2021-03-06 MED ORDER — TETANUS-DIPHTH-ACELL PERTUSSIS 5-2.5-18.5 LF-MCG/0.5 IM SUSY: 0.5000 mL | PREFILLED_SYRINGE | Freq: Once | INTRAMUSCULAR | Status: DC

## 2021-03-06 MED ORDER — OXYTOCIN BOLUS FROM INFUSION
333.0000 mL | Freq: Once | INTRAVENOUS | Status: AC
  Administered 2021-03-06: 333 mL via INTRAVENOUS

## 2021-03-06 MED ORDER — PRENATAL MULTIVITAMIN CH
1.0000 | ORAL_TABLET | Freq: Every day | ORAL | Status: DC
  Administered 2021-03-07 – 2021-03-08 (×2): 1 via ORAL
  Filled 2021-03-06 (×2): qty 1

## 2021-03-06 MED ORDER — DIBUCAINE (PERIANAL) 1 % EX OINT: 1.0000 "application " | TOPICAL_OINTMENT | CUTANEOUS | Status: DC | PRN

## 2021-03-06 MED ORDER — OXYCODONE-ACETAMINOPHEN 5-325 MG PO TABS: 1.0000 | ORAL_TABLET | ORAL | Status: DC | PRN

## 2021-03-06 MED ORDER — LIDOCAINE HCL (PF) 1 % IJ SOLN
INTRAMUSCULAR | Status: DC | PRN
  Administered 2021-03-06: 10 mL via EPIDURAL

## 2021-03-06 MED ORDER — LACTATED RINGERS IV SOLN
500.0000 mL | Freq: Once | INTRAVENOUS | Status: AC
  Administered 2021-03-06: 500 mL via INTRAVENOUS

## 2021-03-06 MED ORDER — IBUPROFEN 600 MG PO TABS
600.0000 mg | ORAL_TABLET | Freq: Four times a day (QID) | ORAL | Status: DC
  Administered 2021-03-06 – 2021-03-08 (×8): 600 mg via ORAL
  Filled 2021-03-06 (×8): qty 1

## 2021-03-06 MED ORDER — TRANEXAMIC ACID-NACL 1000-0.7 MG/100ML-% IV SOLN
1000.0000 mg | INTRAVENOUS | Status: AC
  Administered 2021-03-06: 1000 mg via INTRAVENOUS

## 2021-03-06 MED ORDER — ONDANSETRON HCL 4 MG/2ML IJ SOLN: 4.0000 mg | Freq: Four times a day (QID) | INTRAMUSCULAR | Status: DC | PRN

## 2021-03-06 MED ORDER — COCONUT OIL OIL: 1.0000 "application " | TOPICAL_OIL | Status: DC | PRN

## 2021-03-06 MED ORDER — OXYCODONE-ACETAMINOPHEN 5-325 MG PO TABS: 2.0000 | ORAL_TABLET | ORAL | Status: DC | PRN

## 2021-03-06 MED ORDER — EPHEDRINE 5 MG/ML INJ: 10.0000 mg | INTRAVENOUS | Status: DC | PRN

## 2021-03-06 NOTE — MAU Provider Note (Signed)
S: Ms. Desiree Graham is a 25 y.o. O7H2197 at [redacted]w[redacted]d  who presents to MAU today for labor evaluation.     Cervical exam by RN:  Dilation: 1.5 Effacement (%): 30 Cervical Position: Posterior Station: -2, -3 Presentation: Vertex Exam by:: K.Wilson,RN  Fetal Monitoring: Baseline: 130 bpm Variability: moderate Accelerations: 15x15 present Decelerations: absent Contractions: 4-6 mins  MDM Discussed patient with RN. NST reviewed.   A: SIUP at [redacted]w[redacted]d  False labor  P: Discharge home Labor precautions and kick counts included in AVS Patient to follow-up with [facility] as scheduled  Patient may return to MAU as needed or when in labor     Camelia Eng, MSN, CNM 03/06/2021 12:01 AM

## 2021-03-06 NOTE — Discharge Summary (Addendum)
Postpartum Discharge Summary  Date of Service updated    Patient Name: Desiree Graham DOB: May 06, 1996 MRN: [id number]  Date of admission: 03/06/2021 Delivery date:03/06/2021  Delivering provider: Gifford Shave  Date of discharge: 03/08/2021  Admitting diagnosis: Indication for care in labor or delivery [O75.9] Intrauterine pregnancy: [redacted]w[redacted]d    Secondary diagnosis:  Principal Problem:   SVD (spontaneous vaginal delivery) Active Problems:   HSV-2 seropositive   [place]   Placental abruption  Additional problems: Suspected placental abruption    Discharge diagnosis: Term Pregnancy Delivered                                              Post partum procedures: None Augmentation: AROM Complications: None  Hospital course: Onset of Labor With Vaginal Delivery      25y.o. yo [id number] admitted in Active Labor on 03/06/2021. Patient had an uncomplicated labor course as follows:  Membrane Rupture Time/Date: 11:07 AM ,03/06/2021   Delivery Method:Vaginal, Spontaneous  Episiotomy: None  Lacerations:  None  Patient had an uncomplicated postpartum course.  She is ambulating, tolerating a regular diet, passing flatus, and urinating well. Patient is discharged home in stable condition on 03/09/21.  Newborn Data: Birth date:03/06/2021  Birth time:12:24 PM  Gender:Female  Living status:Living  Apgars:8 ,9  Weight:2441 g   Magnesium Sulfate received: No BMZ received: No Rhophylac:N/A MMR:N/A T-DaP: declined antenatally  Flu: No declined antenatally  Transfusion:No  Physical exam  Vitals:   03/07/21 0406 03/07/21 1420 03/07/21 2350 03/08/21 0545  BP: 99/65 96/68 104/75 108/69  Pulse: 84 78 70 60  Resp: '16 17 18 18  ' Temp: (!) 97.4 F (36.3 C)  98.5 F (36.9 C) 98.5 F (36.9 C)  TempSrc: Axillary  Oral Oral  SpO2: 100%   100%  Weight:      Height:       General: alert, cooperative, and no distress Lochia: appropriate Uterine Fundus:  firm Incision: N/A DVT Evaluation: No evidence of DVT seen on physical exam. Labs: Lab Results  Component Value Date   WBC 12.2 (H) 03/06/2021   HGB 11.8 (L) 03/06/2021   HCT 37.9 03/06/2021   MCV 70.8 (L) 03/06/2021   PLT 284 03/06/2021   No flowsheet data found. Edinburgh Score: Edinburgh Postnatal Depression Scale Screening Tool 03/07/2021  I have been able to laugh and see the funny side of things. 0  I have looked forward with enjoyment to things. 0  I have blamed myself unnecessarily when things went wrong. 0  I have been anxious or worried for no good reason. 0  I have felt scared or panicky for no good reason. 0  Things have been getting on top of me. 1  I have been so unhappy that I have had difficulty sleeping. 0  I have felt sad or miserable. 0  I have been so unhappy that I have been crying. 1  The thought of harming myself has occurred to me. 0  Edinburgh Postnatal Depression Scale Total 2     After visit meds:  Allergies as of 03/08/2021   No Known Allergies      Medication List     STOP taking these medications    ferrous sulfate 325 (65 FE) MG tablet       TAKE these medications    acetaminophen  325 MG tablet Commonly known as: Tylenol Take 2 tablets (650 mg total) by mouth every 4 (four) hours as needed (for pain scale < 4).   ibuprofen 600 MG tablet Commonly known as: ADVIL Take 1 tablet (600 mg total) by mouth every 6 (six) hours.   prenatal multivitamin Tabs tablet Take 1 tablet by mouth daily at 12 noon.         Discharge home in stable condition Infant Feeding: Bottle and Breast Infant Disposition:home with mother Discharge instruction: per After Visit Summary and Postpartum booklet. Activity: Advance as tolerated. Pelvic rest for 6 weeks.  Diet: routine diet Future Appointments:No future appointments. Follow up Visit: Patient to call and schedule appt since she is [facility] patient  Please schedule this patient for a In person  postpartum visit in 4 weeks with the following provider: Any provider. Additional Postpartum F/U: None   Low risk pregnancy complicated by:  nothing Delivery mode:  Vaginal, Spontaneous  Anticipated Birth Control:  Unsure  Patient to receive Depo shot before discharge  Maye Hides, CNM  Renard Matter, MD, MPH OB Fellow, Faculty Practice   Addendum Sept 27, 2022 (8:53 AM) -Discharge orders placed -Plan for Depo prior to D/C.  Gavin Pound MSN, CNM Advanced Practice Provider, Center for [place] of Attending Supervision of [place] Provider (PA/CNM/NP): Evaluation and management procedures were performed by the Advanced Practice Provider under my supervision and collaboration.  I have reviewed the Advanced Practice Provider's note and chart, and I agree with the management and plan. I have also made any necessary editorial changes.   Verita Schneiders, MD, [place] Attending [place], [facility] Center for [employer], [place]

## 2021-03-06 NOTE — Progress Notes (Signed)
LABOR PROGRESS NOTE  Desiree Graham is a 25 y.o. [id number] at [redacted]w[redacted]d  admitted for SOL   Subjective: Feeling the urge to push with contractions.   Objective: BP (!) 133/96   Pulse 74   Temp 97.6 F (36.4 C) (Axillary)   Resp 18   Ht 5\' 2"  (1.575 m)   Wt 57.1 kg   LMP 06/26/2020   SpO2 100%   BMI 23.03 kg/m  or  Vitals:   03/06/21 1053 03/06/21 1055 03/06/21 1100 03/06/21 1130  BP: 114/66 109/75 121/71 (!) 133/96  Pulse: 69 80 74 74  Resp: 18 18 18 18   Temp:    97.6 F (36.4 C)  TempSrc:    Axillary  SpO2:  100% 100% 100%  Weight:      Height:         Dilation: 10 Dilation Complete Date: 03/06/21 Dilation Complete Time: 1058 Effacement (%): 100 Cervical Position: Middle Station: 0 Presentation: Vertex Exam by:: Angelis Gates, MD FHT: baseline rate 135, moderate varibility, + acel, nodecel Toco: Q2-3 min  Labs: Lab Results  Component Value Date   WBC 12.2 (H) 03/06/2021   HGB 11.8 (L) 03/06/2021   HCT 37.9 03/06/2021   MCV 70.8 (L) 03/06/2021   PLT 284 03/06/2021    Patient Active Problem List   Diagnosis Date Noted   Labor and delivery indication for care or intervention 10/11/2018   Anemia affecting pregnancy 09/09/2018   Unwanted fertility 09/09/2018   Fundal height low for dates in third trimester 08/21/2018   Supervision of high-risk pregnancy 04/17/2018   Sickle cell trait (HCC) 04/17/2018   History of preterm delivery 11/07/2017   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 08/27/2017 at [redacted]w[redacted]d here for SOL  Labor: continuing to progress at this time. AROM on this check.  Fetal Wellbeing:  Cat 1, reassuring  Pain Control:  Epidural  Anticipated MOD:  Vaginal   [id number], MD  PGY-3, [facility]  03/06/2021, 11:39 AM

## 2021-03-06 NOTE — Anesthesia Procedure Notes (Signed)
Epidural Patient location during procedure: OB Start time: 03/06/2021 10:16 AM End time: 03/06/2021 10:26 AM  Staffing Anesthesiologist: Lucretia Kern, MD Performed: anesthesiologist   Preanesthetic Checklist Completed: patient identified, IV checked, risks and benefits discussed, monitors and equipment checked, pre-op evaluation and timeout performed  Epidural Patient position: sitting Prep: DuraPrep Patient monitoring: heart rate, continuous pulse ox and blood pressure Approach: midline Location: L3-L4 Injection technique: LOR air  Needle:  Needle type: Tuohy  Needle gauge: 17 G Needle length: 9 cm Needle insertion depth: 4 cm Catheter type: closed end flexible Catheter size: 19 Gauge Catheter at skin depth: 9 cm Test dose: negative  Assessment Events: blood not aspirated, injection not painful, no injection resistance, no paresthesia and negative IV test  Additional Notes Reason for block:procedure for pain

## 2021-03-06 NOTE — H&P (Addendum)
OBSTETRIC ADMISSION HISTORY AND PHYSICAL  Desiree Graham is a 25 y.o. female [id number] with IUP at [redacted]w[redacted]d by 20 wk u/s presenting for SOL. She reports +FMs, No LOF, no VB, no blurry vision, headaches or peripheral edema, and RUQ pain.  She plans on breast and formula feeding. She is considering BTL for birth control and reports she signed papers but cannot find them. She received her prenatal care at [facility]   Dating: By 20 wk [place] --->  Estimated Date of Delivery: 03/23/21  Sono:    @[redacted]w[redacted]d , CWD, Concern for VSD with referral for fetal echo, cephalic presentation, anterior lie, 1669g, 17% EFW   Prenatal History/Complications:  Concern for VSD- fetus was supposed to have echo completed 10/6 but [facility].  Hx HSV on valtrex   Past Medical History: Past Medical History:  Diagnosis Date   Anemia    Sickle cell trait (HCC)     Past Surgical History: Past Surgical History:  Procedure Laterality Date   NO PAST SURGERIES      Obstetrical History: OB History     Gravida  5   Para  4   Term  3   Preterm  1   AB  0   Living  4      SAB  0   IAB  0   Ectopic  0   Multiple  0   Live Births  4           Social History Social History   Socioeconomic History   Marital status: Single    Spouse name: Not on file   Number of children: Not on file   Years of education: Not on file   Highest education level: Not on file  Occupational History   Not on file  Tobacco Use   Smoking status: Former    Types: Cigarettes    Quit date: 04/12/2016    Years since quitting: 4.9   Smokeless tobacco: Never  Vaping Use   Vaping Use: Never used  Substance and Sexual Activity   Alcohol use: No   Drug use: No   Sexual activity: Yes    Birth control/protection: None  Other Topics Concern   Not on file  Social History Narrative   Not on file   Social Determinants of Health   Financial Resource Strain: Not on file  Food Insecurity: Not on file  Transportation Needs: Not on file   Physical Activity: Not on file  Stress: Not on file  Social Connections: Not on file    Family History: Family History  Problem Relation Age of Onset   Miscarriages / Stillbirths Paternal Aunt     Allergies: No Known Allergies  Medications Prior to Admission  Medication Sig Dispense Refill Last Dose   Prenatal Vit-Fe Fumarate-FA (PRENATAL MULTIVITAMIN) TABS tablet Take 1 tablet by mouth daily at 12 noon.   03/05/2021   ferrous sulfate 325 (65 FE) MG tablet Take 1 tablet (325 mg total) by mouth 2 (two) times daily with a meal for 30 days. 60 tablet 0      Review of Systems   All systems reviewed and negative except as stated in HPI  Blood pressure 109/79, pulse 92, temperature 98.6 F (37 C), temperature source Oral, resp. rate 18, height 5\' 2"  (1.575 m), weight 57.1 kg, last menstrual period 06/26/2020, SpO2 100 %. General appearance: alert, cooperative, and moderate distress Lungs: clear to auscultation bilaterally Heart: regular rate and rhythm Abdomen: soft, non-tender; bowel sounds normal Pelvic:  Patient declined speculum exam. External exam showing no HSV lesions  Extremities: Homans sign is negative, no sign of DVT Presentation: cephalic Fetal monitoringBaseline: 135 bpm, Variability: Good {> 6 bpm), Accelerations: Reactive, and Decelerations: Absent Uterine activityFrequency: Every 2 minutes Dilation: 6 Effacement (%): 90 Station: -1 Exam by:: E.Edwards/J.Spurlock RN   Prenatal labs: ABO, Rh: --/--/A POS (09/25 0920)A positive  Antibody: NEG (09/25 0920)Negative Rubella:  Immune  RPR:   Negative  HBsAg:   non-reactive  HIV:   negative  GBS:   negative 1 hr Glucola normal Genetic screening  declined Anatomy US concern for VSD but otherwise normal   Prenatal Transfer Tool  Maternal Diabetes: No Genetic Screening: Declined Maternal Ultrasounds/Referrals: Cardiac defect Fetal Ultrasounds or other Referrals:  Fetal echo, Referred to Materal Fetal Medicine   Maternal Substance Abuse:  No Significant Maternal Medications:  None Significant Maternal Lab Results: Group B Strep negative and Other: Sickle cell carrier, suspected SVD.   Results for orders placed or performed during the hospital encounter of 03/06/21 (from the past 24 hour(s))  Resp Panel by RT-PCR (Flu A&B, Covid) Nasopharyngeal Swab   Collection Time: 03/06/21  9:07 AM   Specimen: Nasopharyngeal Swab; Nasopharyngeal(NP) swabs in vial transport medium  Result Value Ref Range   SARS Coronavirus 2 by RT PCR NEGATIVE NEGATIVE   Influenza A by PCR NEGATIVE NEGATIVE   Influenza B by PCR NEGATIVE NEGATIVE  Type and screen [facility]   Collection Time: 03/06/21  9:20 AM  Result Value Ref Range   ABO/RH(D) A POS    Antibody Screen NEG    Sample Expiration      03/09/2021,2359 Performed at [facility], [address] [place], [place] [address]   CBC   Collection Time: 03/06/21  9:24 AM  Result Value Ref Range   WBC 12.2 (H) 4.0 - 10.5 K/uL   RBC 5.35 (H) 3.87 - 5.11 MIL/uL   Hemoglobin 11.8 (L) 12.0 - 15.0 g/dL   HCT 31.5 17.6 - 16.0 %   MCV 70.8 (L) 80.0 - 100.0 fL   MCH 22.1 (L) 26.0 - 34.0 pg   MCHC 31.1 30.0 - 36.0 g/dL   RDW 73.7 (H) 10.6 - 26.9 %   Platelets 284 150 - 400 K/uL   nRBC 0.0 0.0 - 0.2 %    Patient Active Problem List   Diagnosis Date Noted   Labor and delivery indication for care or intervention 10/11/2018   Anemia affecting pregnancy 09/09/2018   Unwanted fertility 09/09/2018   Fundal height low for dates in third trimester 08/21/2018   Supervision of high-risk pregnancy 04/17/2018   Sickle cell trait (HCC) 04/17/2018   History of preterm delivery 11/07/2017   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 [id number] at [redacted]w[redacted]d here for SOL.  #Labor:Progressing spontaneously, continue to monitor  #Pain: Epidural when able  #FWB: Cat 1, reassuring #ID:  GBS neg. Hx HSV on valtrex, no lesions  noted although patient declined speculum exam #MOF: breast and formula  #MOC:Undecided but considering tubal, signed papers   #Concern for VSD: was scheduled for fetal echo on 9/23 but had to reschedule. NICU aware.   Derrel Nip, MD  03/06/2021, 10:55 AM  I was present for the exam and agree with the above.   Katrinka Blazing, [place], [place] 03/06/2021 12:50 PM

## 2021-03-06 NOTE — Anesthesia Preprocedure Evaluation (Signed)
Anesthesia Evaluation  Patient identified by MRN, date of birth, ID band Patient awake    Reviewed: Allergy & Precautions, H&P , NPO status , Patient's Chart, lab work & pertinent test results  History of Anesthesia Complications Negative for: history of anesthetic complications  Airway Mallampati: II  TM Distance: >3 FB     Dental   Pulmonary neg pulmonary ROS, former smoker,    Pulmonary exam normal        Cardiovascular negative cardio ROS   Rhythm:regular Rate:Normal     Neuro/Psych negative neurological ROS  negative psych ROS   GI/Hepatic negative GI ROS, Neg liver ROS,   Endo/Other  negative endocrine ROS  Renal/GU negative Renal ROS  negative genitourinary   Musculoskeletal   Abdominal   Peds  Hematology negative hematology ROS (+)   Anesthesia Other Findings   Reproductive/Obstetrics (+) Pregnancy                             Anesthesia Physical Anesthesia Plan  ASA: 2  Anesthesia Plan: Epidural   Post-op Pain Management:    Induction:   PONV Risk Score and Plan:   Airway Management Planned:   Additional Equipment:   Intra-op Plan:   Post-operative Plan:   Informed Consent: I have reviewed the patients History and Physical, chart, labs and discussed the procedure including the risks, benefits and alternatives for the proposed anesthesia with the patient or authorized representative who has indicated his/her understanding and acceptance.       Plan Discussed with:   Anesthesia Plan Comments:         Anesthesia Quick Evaluation

## 2021-03-06 NOTE — Lactation Note (Signed)
This note was copied from a baby's chart. Lactation Consultation Note  Patient Name: Desiree Graham [id number] Date: 03/06/2021 Reason for consult: Initial assessment;Early term 37-38.6wks Age:26 hours  LC in to room for initial consult. Infant is latched upon arrival. Assisted with alignment and support pillows. Audible swallows as infant breastfeeds. Talked about hand expression, mother expresses discomfort. Provided manual pump.  Mother reports contractions with latch. Explained it can be normal due to uterus going back to normal size.  Reviewed normal newborn behavior during first 24h, expected output and feeding frequency.   Plan: 1-Skin to skin, aim for a deep, comfortable latch and breastfeed on demand or 8-12 times in 24h period. 2-Encouraged maternal rest, hydration and food intake.  3-Contact LC as needed for feeds/support/concerns/questions   All questions answered at this time. Provided Lactation services brochure and promoted [employer] booklet information.    Maternal Data Has patient been taught Hand Expression?: Yes Does the patient have breastfeeding experience prior to this delivery?: Yes How long did the patient breastfeed?: 4 months x1  Feeding Mother's Current Feeding Choice: Breast Milk and Formula  LATCH Score Latch: Grasps breast easily, tongue down, lips flanged, rhythmical sucking.  Audible Swallowing: Spontaneous and intermittent  Type of Nipple: Everted at rest and after stimulation  Comfort (Breast/Nipple): Soft / non-tender  Hold (Positioning): Assistance needed to correctly position infant at breast and maintain latch.  LATCH Score: 9   Lactation Tools Discussed/Used Tools: Pump Breast pump type: Manual Pump Education: Milk Storage;Setup, frequency, and cleaning Reason for Pumping: mother's request Pumping frequency: as needed Pumped volume:  (has not started yet)  Interventions Interventions: Breast feeding basics reviewed;Assisted with  latch;Skin to skin;Hand express;Adjust position;Support pillows;Expressed milk;Hand pump;Education  Discharge Pump: Manual WIC Program: Yes  Consult Status Consult Status: Follow-up Date: 03/07/21 Follow-up type: In-patient    Amani Nodarse A Higuera Ancidey 03/06/2021, 4:02 PM

## 2021-03-06 NOTE — Lactation Note (Signed)
This note was copied from a baby's chart. Lactation Consultation Note  Patient Name: Desiree Graham [id number] Date: 03/06/2021 Reason for consult: L&D Initial assessment;Early term 37-38.6wks Age:26 hours  LC in to assist with first feeding in L&D.  Baby already latched well to breast for 15 mins, in laid back cradle hold sucking and swallowing well.  Encouraged STS with baby and watching for feeding cues.  Mom to ask for help prn.    LC to F/U on MBU  Maternal Data Has patient been taught Hand Expression?: Yes Does the patient have breastfeeding experience prior to this delivery?: Yes How long did the patient breastfeed?: with first baby for a month  Feeding Mother's Current Feeding Choice: Breast Milk and Formula  LATCH Score Latch: Grasps breast easily, tongue down, lips flanged, rhythmical sucking.  Audible Swallowing: Spontaneous and intermittent  Type of Nipple: Everted at rest and after stimulation  Comfort (Breast/Nipple): Soft / non-tender  Hold (Positioning): No assistance needed to correctly position infant at breast.  LATCH Score: 10   Interventions Interventions: Breast feeding basics reviewed;Skin to skin;Breast massage;Hand express;Breast compression   Consult Status Consult Status: Follow-up from L&D Date: 03/06/21 Follow-up type: In-patient    Desiree Graham 03/06/2021, 1:35 PM

## 2021-03-06 NOTE — MAU Note (Signed)
Contractions started last night, have gotten closer and stronger, now 3-5 min apart. No bleeding or leaking. Was here last night, was 1 cm.

## 2021-03-07 DIAGNOSIS — O4590 Premature separation of placenta, unspecified, unspecified trimester: Secondary | ICD-10-CM

## 2021-03-07 LAB — RPR: RPR Ser Ql: NONREACTIVE

## 2021-03-07 MED ORDER — ACETAMINOPHEN 325 MG PO TABS: 650.0000 mg | ORAL_TABLET | ORAL | 0 refills | Status: AC | PRN

## 2021-03-07 MED ORDER — IBUPROFEN 600 MG PO TABS: 600.0000 mg | ORAL_TABLET | Freq: Four times a day (QID) | ORAL | 0 refills | Status: AC

## 2021-03-07 MED ORDER — MEDROXYPROGESTERONE ACETATE 150 MG/ML IM SUSP
150.0000 mg | Freq: Once | INTRAMUSCULAR | Status: AC
  Administered 2021-03-08: 150 mg via INTRAMUSCULAR
  Filled 2021-03-07: qty 1

## 2021-03-07 NOTE — Lactation Note (Signed)
This note was copied from a baby's chart. Lactation Consultation Note  Patient Name: Desiree Graham [id number] Date: 03/07/2021 Reason for consult: Follow-up assessment;Mother's request;Early term 37-38.6wks Age:26 hours  LC assisted mom getting a deeper latch.  LC reviewed BF supplementation volume Mom to offer ebm first followed by formula with pace bottle feeding using Dr Manson Passey preemie nipple provided.  All questions answered at the end of the visit.   Mom declined use of DEBP at this time.   Maternal Data Has patient been taught Hand Expression?: Yes  Feeding Mother's Current Feeding Choice: Breast Milk and Formula Nipple Type: Dr. Lorne Skeens  LATCH Score Latch: Repeated attempts needed to sustain latch, nipple held in mouth throughout feeding, stimulation needed to elicit sucking reflex.  Audible Swallowing: Spontaneous and intermittent  Type of Nipple: Everted at rest and after stimulation  Comfort (Breast/Nipple): Soft / non-tender  Hold (Positioning): Assistance needed to correctly position infant at breast and maintain latch.  LATCH Score: 8   Lactation Tools Discussed/Used Tools: Pump;Flanges Flange Size: 24 Pump Education: Setup, frequency, and cleaning;Milk Storage Reason for Pumping: increase stimulation Pumping frequency: every 3 hrs for 10 min each breast  Interventions Interventions: Breast feeding basics reviewed;Breast compression;Assisted with latch;Adjust position;Skin to skin;Support pillows;Breast massage;Position options;Hand express;Expressed milk;Education;Hand pump;Pace feeding  Discharge    Consult Status Consult Status: Follow-up Date: 03/08/21 Follow-up type: In-patient    [name]  [name] 03/07/2021, 3:03 PM

## 2021-03-07 NOTE — Anesthesia Postprocedure Evaluation (Signed)
Anesthesia Post Note  Patient: Desiree Graham  Procedure(s) Performed: AN AD HOC LABOR EPIDURAL     Patient location during evaluation: Mother Baby Anesthesia Type: Epidural Level of consciousness: awake, oriented and awake and alert Pain management: pain level controlled Vital Signs Assessment: post-procedure vital signs reviewed and stable Respiratory status: spontaneous breathing, respiratory function stable and nonlabored ventilation Cardiovascular status: stable Postop Assessment: no headache, adequate PO intake, able to ambulate, no apparent nausea or vomiting and patient able to bend at knees Anesthetic complications: no   No notable events documented.  Last Vitals:  Vitals:   03/06/21 2351 03/07/21 0406  BP: 106/76 99/65  Pulse: 67 84  Resp: 16 16  Temp: 36.7 C (!) 36.3 C  SpO2: 99% 100%    Last Pain:  Vitals:   03/07/21 0825  TempSrc:   PainSc: 3    Pain Goal:                Epidural/Spinal Function Cutaneous sensation: Normal sensation (03/07/21 0825), Patient able to flex knees: Yes (03/07/21 0825), Patient able to lift hips off bed: Yes (03/07/21 0825), Back pain beyond tenderness at insertion site: No (03/07/21 0825), Progressively worsening motor and/or sensory loss: No (03/07/21 0825), Bowel and/or bladder incontinence post epidural: No (03/07/21 0825)  Desiree Graham

## 2021-03-08 DIAGNOSIS — Z3A37 37 weeks gestation of pregnancy: Secondary | ICD-10-CM | POA: Diagnosis not present

## 2021-03-08 DIAGNOSIS — O9852 Other viral diseases complicating childbirth: Secondary | ICD-10-CM | POA: Diagnosis not present

## 2021-03-08 DIAGNOSIS — O4593 Premature separation of placenta, unspecified, third trimester: Secondary | ICD-10-CM | POA: Diagnosis not present

## 2021-03-08 NOTE — Lactation Note (Signed)
This note was copied from a baby's chart. Lactation Consultation Note  Patient Name: Desiree Graham [id number] Date: 03/08/2021 Reason for consult: Follow-up assessment;Early term 37-38.6wks Age:26 hours  P5 mother whose infant is now 61 hours old.  This is an ETI at 37+4 weeks.  Mother breast fed her first child for 4 months.  Her current feeding preference is breast/formula.  Mother reports no concerns with breast or bottle feeding.  She is breast feeding and supplementing with Similac 20 calorie formula.  Baby is latching with scores of 9-10 and consuming approximately 15 mls of formula.  Mother had an SLP consult yesterday but stated that her daughter has been bottle feeding well.  She has recently used the gold nipple for feedings.  Mother will continue to feed at least every three hours or sooner if baby desires.  She plans to make a [facility] appointment to discuss feeding; may take the formula package from [facility].  Mother stated she plans to buy a DEBP; no private insurance.    Mother is hoping for a discharge today.  Offered to return for latch assistance if desired.  No support person present at this time.     Maternal Data Has patient been taught Hand Expression?: Yes Does the patient have breastfeeding experience prior to this delivery?: Yes How long did the patient breastfeed?: 4 months with first child  Feeding Mother's Current Feeding Choice: Breast Milk and Formula  LATCH Score                    Lactation Tools Discussed/Used Tools: Pump Flange Size: 24 Breast pump type: Manual Reason for Pumping: Stimulation for supplementation Pumping frequency: PRN  Interventions Interventions: Education  Discharge Discharge Education: Engorgement and breast care Pump: Manual;Personal WIC Program: Yes  Consult Status Consult Status: Complete Date: 03/08/21 Follow-up type: Call as needed    Caleb Prigmore R Cuthbert Turton 03/08/2021, 1:32 PM

## 2021-03-08 NOTE — Social Work (Signed)
CSW received consult for Patient Request.  CSW met with MOB to offer support.   CSW met with MOB at bedside and introduced role. MOB presented calm and receptive to [place] visit. MOB informed CSW that she needs a car seat for the infant. She has a Financial planner that has been helping with baby supplies however they do not have car seats in [place].  CSW provided MOB with a car seat.   Kathrin Greathouse, MSW, LCSW [facility] [place] Worker  [phone] 03/08/2021  12:00 PM

## 2021-03-08 NOTE — Progress Notes (Signed)
Desiree Graham  Post Partum Day 2:S/P SVD   Subjective: Patient up ad lib, denies syncope or dizziness. Reports consuming regular diet without issues and denies N/V. Denies issues with urination and reports bleeding is "light with some grape sized clots."  Patient is breastfeeding and reports going well.  Desires Depo for postpartum contraception.  Pain is being appropriately managed with use of ibuprofen.  Objective: Vitals:   03/07/21 0406 03/07/21 1420 03/07/21 2350 03/08/21 0545  BP: 99/65 96/68 104/75 108/69  Pulse: 84 78 70 60  Resp: 16 17 18 18   Temp: (!) 97.4 F (36.3 C)  98.5 F (36.9 C) 98.5 F (36.9 C)  TempSrc: Axillary  Oral Oral  SpO2: 100%   100%  Weight:      Height:       Recent Labs    03/06/21 0924  HGB 11.8*  HCT 37.9    Physical Exam:  General: alert, cooperative, and no distress Mood/Affect: Appropriate/Congruent Lungs: clear to auscultation, no wheezes, rales or rhonchi, symmetric air entry.  Heart: normal rate and regular rhythm. Breast: lactating, no erythema or tenderness, nipples normal. Abdomen:  + bowel sounds, Soft, NT Uterine Fundus: firm, U/-2 Lochia: appropriate Laceration: None Skin: Warm, Dry DVT Evaluation: No evidence of DVT seen on physical exam. No significant calf/ankle edema.  Assessment S/P Vaginal Delivery-Day2 Normal [employer] BreastFeeding Depo Provera  Plan: Plan for discharge today Will order Depo Provera to receive prior to d/c Patient declines ibuprofen for home usage. Reviewed warning/return precautions.  Instructed to follow up with HD as indicated. Continue current care until time of discharge.   03/08/21, MSN, CNM 03/08/2021, 5:52 AM

## 2021-03-18 ENCOUNTER — Telehealth (HOSPITAL_COMMUNITY): Payer: Self-pay

## 2021-03-18 NOTE — Telephone Encounter (Signed)
  No answer. Left message to return nurse call.  Marcelino Duster [facility] 03/18/2021,1604

## 2024-02-25 ENCOUNTER — Emergency Department (HOSPITAL_COMMUNITY): Admission: EM | Admit: 2024-02-25 | Discharge: 2024-02-25 | Disposition: A | Discharge status: Home / Self Care

## 2024-02-25 ENCOUNTER — Encounter (HOSPITAL_COMMUNITY): Payer: Self-pay | Admitting: *Deleted

## 2024-02-25 ENCOUNTER — Other Ambulatory Visit: Payer: Self-pay

## 2024-02-25 DIAGNOSIS — R590 Localized enlarged lymph nodes: Secondary | ICD-10-CM | POA: Diagnosis not present

## 2024-02-25 DIAGNOSIS — R221 Localized swelling, mass and lump, neck: Secondary | ICD-10-CM | POA: Diagnosis present

## 2024-02-25 DIAGNOSIS — R599 Enlarged lymph nodes, unspecified: Secondary | ICD-10-CM

## 2024-02-25 NOTE — ED Notes (Signed)
 Pt given discharge instructions to follow up with Otolaryngology. Verbalized understanding.

## 2024-02-25 NOTE — ED Notes (Signed)
 Nurse went in room to obtain blood work and prepare pt for CT with contrast as ordered and she declined to have any of it done. States she does not have that kind of time to wait for any of that and would prefer to schedule a appt for a later date. ED Provider made aware.

## 2024-02-25 NOTE — Discharge Instructions (Signed)
 I suspect that you have a swollen lymph node.  It may also be an epidermal inclusion cyst.  You can follow-up with the ear nose and throat doctor whose contact information I provided above or with your primary care doctor to schedule an outpatient image of this lesion.  If you begin having significant fevers, chills, severe worsening of the pain or swelling associated with the lump then please return for further evaluation.

## 2024-02-25 NOTE — ED Triage Notes (Signed)
 C/o knot on the left side of her neck noticed in May , no pain states it just hasn't gone down

## 2024-02-25 NOTE — ED Provider Notes (Signed)
 [name] [facility] Provider Note   CSN: [id number] Arrival date & time: 02/25/24  9280     Patient presents with: knot on neck   Desiree Graham is a 28 y.o. female with past medical history significant for anemia, sickle cell trait, otherwise unremarkable past medical history who presents concern for lump on the left side of her neck.  She reports that it has been present since May.  Initially tender, mostly does not cause her any difficulty, but is fairly painful to the touch.  She denies any fever, chills, but is concerned that it may have changed and sure that there may be another 1 present on the other side.  She denies any fever, chills, sore throat, dental infection, nausea, vomiting, weight loss.   HPI     Prior to Admission medications   Medication Sig Start Date End Date Taking? Authorizing Provider  acetaminophen  (TYLENOL ) 325 MG tablet Take 2 tablets (650 mg total) by mouth every 4 (four) hours as needed (for pain scale < 4). 03/07/21   Kooistra, Kathryn Lorraine, CNM  ibuprofen  (ADVIL ) 600 MG tablet Take 1 tablet (600 mg total) by mouth every 6 (six) hours. 03/07/21   Kooistra, Kathryn Lorraine, CNM  Prenatal Vit-Fe Fumarate-FA (PRENATAL MULTIVITAMIN) TABS tablet Take 1 tablet by mouth daily at 12 noon.    [provider]    Allergies: Patient has no known allergies.    Review of Systems  All other systems reviewed and are negative.   Updated Vital Signs BP 105/68 (BP Location: Left Arm)   Pulse 61   Temp (!) 97.5 F (36.4 C) (Oral)   Resp 18   Ht 5' 2 (1.575 m)   Wt 39.9 kg   SpO2 99%   BMI 16.10 kg/m   Physical Exam Vitals and nursing note reviewed.  Constitutional:      General: She is not in acute distress.    Appearance: Normal appearance.  HENT:     Head: Normocephalic and atraumatic.  Eyes:     General:        Right eye: No discharge.        Left eye: No discharge.  Cardiovascular:     Rate and  Rhythm: Normal rate and regular rhythm.     Heart sounds: No murmur heard.    No friction rub. No gallop.  Pulmonary:     Effort: Pulmonary effort is normal.     Breath sounds: Normal breath sounds.  Abdominal:     General: Bowel sounds are normal.     Palpations: Abdomen is soft.  Skin:    General: Skin is warm and dry.     Capillary Refill: Capillary refill takes less than 2 seconds.     Comments: Firm, mobile node at corner of mandible, no fluctuance, some tenderness to palpation, no surrounding skin swelling.  Lump present on the left, no lesion present on contralateral side.  Neurological:     Mental Status: She is alert and oriented to person, place, and time.  Psychiatric:        Mood and Affect: Mood normal.        Behavior: Behavior normal.     (all labs ordered are listed, but only abnormal results are displayed) Labs Reviewed - No data to display  EKG: None  Radiology: No results found.   Procedures   Medications Ordered in the ED - No data to display  Medical Decision Making  This patient is a 28 y.o. female who presents to the ED for concern of lump on neck.   Differential diagnoses prior to evaluation: Swollen lymph node, epidermal inclusion cyst, possible lymphoma, less likely thyroid nodule, peritonsillar abscess, retropharyngeal abscess, epiglottitis, tonsillitis, abscess, versus other  Past Medical History / Social History / Additional history: Chart reviewed. Pertinent results include: Anemia  Physical Exam: Physical exam performed. The pertinent findings include: Vital signs stable,  Firm, mobile node at corner of mandible, no fluctuance, some tenderness to palpation, no surrounding skin swelling.  Lump present on the left, no lesion present on contralateral side.  Medications / Treatment: I discussed with patient that overall I do not see any emergent pathology, due to the tenderness to palpation, I think would  be reasonable for some basic lab work, imaging to rule out any sort of infectious etiology, evaluate for lymphoma or other concerning pathology, patient initially agreed to lab work and imaging, but later changed her mind, I think this is reasonable, I do not see any need for emergent evaluation of a largely painless lump that has been present for 5 months, encouraged her to follow-up with ENT, primary care doctor for outpatient imaging evaluation.   Disposition: After consideration of the diagnostic results and the patients response to treatment, I feel that patient stable for discharge with plan as above.   =emergency department workup does not suggest an emergent condition requiring admission or immediate intervention beyond what has been performed at this time. The plan is: as above. The patient is safe for discharge and has been instructed to return immediately for worsening symptoms, change in symptoms or any other concerns.   Final diagnoses:  Swollen lymph nodes    ED Discharge Orders     None          [name], PA-C 02/25/24 [id number]    [name], DO 02/25/24 1501
